# Patient Record
Sex: Female | Born: 1935
Health system: Southern US, Community
[De-identification: ages and names within clinical notes are randomized; demographics above are authoritative.]

## PROBLEM LIST (undated history)

## (undated) DIAGNOSIS — K219 Gastro-esophageal reflux disease without esophagitis: Secondary | ICD-10-CM

## (undated) DIAGNOSIS — E039 Hypothyroidism, unspecified: Secondary | ICD-10-CM

## (undated) DIAGNOSIS — I10 Essential (primary) hypertension: Secondary | ICD-10-CM

## (undated) DIAGNOSIS — E079 Disorder of thyroid, unspecified: Secondary | ICD-10-CM

## (undated) DIAGNOSIS — E119 Type 2 diabetes mellitus without complications: Secondary | ICD-10-CM

## (undated) HISTORY — PX: BREAST SURGERY: SHX581

---

## 2003-02-05 ENCOUNTER — Encounter: Payer: Self-pay | Admitting: Family Medicine

## 2003-02-05 ENCOUNTER — Ambulatory Visit (HOSPITAL_COMMUNITY): Admission: RE | Admit: 2003-02-05 | Discharge: 2003-02-05 | Payer: Self-pay | Admitting: Family Medicine

## 2008-07-09 ENCOUNTER — Ambulatory Visit (HOSPITAL_COMMUNITY): Admission: RE | Admit: 2008-07-09 | Discharge: 2008-07-09 | Payer: Self-pay | Admitting: Family Medicine

## 2009-05-26 ENCOUNTER — Ambulatory Visit (HOSPITAL_COMMUNITY): Admission: RE | Admit: 2009-05-26 | Discharge: 2009-05-26 | Payer: Self-pay | Admitting: Family Medicine

## 2010-10-08 ENCOUNTER — Encounter: Payer: Self-pay | Admitting: Family Medicine

## 2011-04-25 ENCOUNTER — Other Ambulatory Visit (HOSPITAL_COMMUNITY): Payer: Self-pay | Admitting: Family Medicine

## 2011-04-25 DIAGNOSIS — I739 Peripheral vascular disease, unspecified: Secondary | ICD-10-CM

## 2011-05-10 ENCOUNTER — Other Ambulatory Visit (HOSPITAL_COMMUNITY): Payer: Self-pay

## 2011-06-04 ENCOUNTER — Other Ambulatory Visit (HOSPITAL_COMMUNITY): Payer: Self-pay

## 2012-09-29 ENCOUNTER — Other Ambulatory Visit (HOSPITAL_COMMUNITY): Payer: Self-pay | Admitting: Family Medicine

## 2012-09-29 DIAGNOSIS — Z139 Encounter for screening, unspecified: Secondary | ICD-10-CM

## 2012-10-02 ENCOUNTER — Ambulatory Visit (HOSPITAL_COMMUNITY)
Admission: RE | Admit: 2012-10-02 | Discharge: 2012-10-02 | Disposition: A | Discharge status: Home / Self Care | Payer: Medicare Other | Source: Ambulatory Visit | Attending: Family Medicine | Admitting: Family Medicine

## 2012-10-02 DIAGNOSIS — Z139 Encounter for screening, unspecified: Secondary | ICD-10-CM

## 2013-12-17 ENCOUNTER — Other Ambulatory Visit (HOSPITAL_COMMUNITY): Payer: Self-pay | Admitting: Family Medicine

## 2013-12-17 DIAGNOSIS — Z1231 Encounter for screening mammogram for malignant neoplasm of breast: Secondary | ICD-10-CM

## 2013-12-29 ENCOUNTER — Ambulatory Visit (HOSPITAL_COMMUNITY): Payer: Medicare Other

## 2014-09-04 ENCOUNTER — Inpatient Hospital Stay (HOSPITAL_COMMUNITY)
Admission: EM | Admit: 2014-09-04 | Discharge: 2014-09-07 | DRG: 441 | Disposition: A | Discharge status: Home / Self Care | Payer: Medicare Other | Attending: General Surgery | Admitting: General Surgery

## 2014-09-04 ENCOUNTER — Encounter (HOSPITAL_COMMUNITY): Payer: Self-pay | Admitting: Emergency Medicine

## 2014-09-04 ENCOUNTER — Emergency Department (HOSPITAL_COMMUNITY): Payer: Medicare Other

## 2014-09-04 DIAGNOSIS — E119 Type 2 diabetes mellitus without complications: Secondary | ICD-10-CM | POA: Diagnosis present

## 2014-09-04 DIAGNOSIS — S36113A Laceration of liver, unspecified degree, initial encounter: Principal | ICD-10-CM | POA: Diagnosis present

## 2014-09-04 DIAGNOSIS — K661 Hemoperitoneum: Secondary | ICD-10-CM | POA: Diagnosis present

## 2014-09-04 DIAGNOSIS — D62 Acute posthemorrhagic anemia: Secondary | ICD-10-CM | POA: Diagnosis not present

## 2014-09-04 DIAGNOSIS — I10 Essential (primary) hypertension: Secondary | ICD-10-CM | POA: Diagnosis present

## 2014-09-04 DIAGNOSIS — R1011 Right upper quadrant pain: Secondary | ICD-10-CM

## 2014-09-04 DIAGNOSIS — E079 Disorder of thyroid, unspecified: Secondary | ICD-10-CM | POA: Diagnosis present

## 2014-09-04 DIAGNOSIS — S36115A Moderate laceration of liver, initial encounter: Secondary | ICD-10-CM | POA: Diagnosis present

## 2014-09-04 DIAGNOSIS — I951 Orthostatic hypotension: Secondary | ICD-10-CM

## 2014-09-04 DIAGNOSIS — D649 Anemia, unspecified: Secondary | ICD-10-CM | POA: Diagnosis not present

## 2014-09-04 HISTORY — DX: Disorder of thyroid, unspecified: E07.9

## 2014-09-04 HISTORY — DX: Type 2 diabetes mellitus without complications: E11.9

## 2014-09-04 HISTORY — DX: Essential (primary) hypertension: I10

## 2014-09-04 MED ORDER — SODIUM CHLORIDE 0.9 % IV SOLN
1000.0000 mL | Freq: Once | INTRAVENOUS | Status: AC
  Administered 2014-09-04: 1000 mL via INTRAVENOUS

## 2014-09-04 MED ORDER — SODIUM CHLORIDE 0.9 % IV SOLN
1000.0000 mL | INTRAVENOUS | Status: DC
  Administered 2014-09-05: 1000 mL via INTRAVENOUS
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
## 2014-09-05 ENCOUNTER — Encounter (HOSPITAL_COMMUNITY): Payer: Self-pay | Admitting: *Deleted

## 2014-09-05 DIAGNOSIS — K661 Hemoperitoneum: Secondary | ICD-10-CM | POA: Diagnosis present

## 2014-09-05 DIAGNOSIS — S36115A Moderate laceration of liver, initial encounter: Secondary | ICD-10-CM | POA: Diagnosis present

## 2014-09-05 DIAGNOSIS — R1011 Right upper quadrant pain: Secondary | ICD-10-CM | POA: Diagnosis present

## 2014-09-05 DIAGNOSIS — I10 Essential (primary) hypertension: Secondary | ICD-10-CM | POA: Diagnosis present

## 2014-09-05 DIAGNOSIS — S36113A Laceration of liver, unspecified degree, initial encounter: Secondary | ICD-10-CM | POA: Diagnosis present

## 2014-09-05 DIAGNOSIS — D62 Acute posthemorrhagic anemia: Secondary | ICD-10-CM | POA: Diagnosis not present

## 2014-09-05 DIAGNOSIS — E079 Disorder of thyroid, unspecified: Secondary | ICD-10-CM | POA: Diagnosis present

## 2014-09-05 DIAGNOSIS — E119 Type 2 diabetes mellitus without complications: Secondary | ICD-10-CM | POA: Diagnosis present

## 2014-09-05 LAB — COMPREHENSIVE METABOLIC PANEL
ALBUMIN: 3.2 g/dL — AB (ref 3.5–5.2)
ALK PHOS: 40 U/L (ref 39–117)
ALT: 16 U/L (ref 0–35)
ALT: 17 U/L (ref 0–35)
AST: 20 U/L (ref 0–37)
AST: 21 U/L (ref 0–37)
Albumin: 3.4 g/dL — ABNORMAL LOW (ref 3.5–5.2)
Alkaline Phosphatase: 45 U/L (ref 39–117)
Anion gap: 14 (ref 5–15)
Anion gap: 16 — ABNORMAL HIGH (ref 5–15)
BUN: 21 mg/dL (ref 6–23)
BUN: 25 mg/dL — ABNORMAL HIGH (ref 6–23)
CALCIUM: 7.8 mg/dL — AB (ref 8.4–10.5)
CHLORIDE: 102 meq/L (ref 96–112)
CO2: 16 meq/L — AB (ref 19–32)
CO2: 21 mEq/L (ref 19–32)
CREATININE: 1 mg/dL (ref 0.50–1.10)
Calcium: 7.7 mg/dL — ABNORMAL LOW (ref 8.4–10.5)
Chloride: 103 mEq/L (ref 96–112)
Creatinine, Ser: 1.35 mg/dL — ABNORMAL HIGH (ref 0.50–1.10)
GFR calc Af Amer: 41 mL/min — ABNORMAL LOW (ref >90)
GFR calc Af Amer: 60 mL/min — ABNORMAL LOW (ref >90)
GFR calc non Af Amer: 36 mL/min — ABNORMAL LOW (ref >90)
GFR, EST NON AFRICAN AMERICAN: 51 mL/min — AB (ref >90)
Glucose, Bld: 161 mg/dL — ABNORMAL HIGH (ref 70–99)
Glucose, Bld: 176 mg/dL — ABNORMAL HIGH (ref 70–99)
POTASSIUM: 4.4 meq/L (ref 3.7–5.3)
Potassium: 4.8 mEq/L (ref 3.7–5.3)
SODIUM: 135 meq/L — AB (ref 137–147)
Sodium: 137 mEq/L (ref 137–147)
Total Bilirubin: 0.3 mg/dL (ref 0.3–1.2)
Total Bilirubin: 0.9 mg/dL (ref 0.3–1.2)
Total Protein: 5.7 g/dL — ABNORMAL LOW (ref 6.0–8.3)
Total Protein: 6.1 g/dL (ref 6.0–8.3)

## 2014-09-05 LAB — PROTIME-INR
INR: 1.11 (ref 0.00–1.49)
Prothrombin Time: 14.4 seconds (ref 11.6–15.2)

## 2014-09-05 LAB — SAMPLE TO BLOOD BANK

## 2014-09-05 LAB — CBC WITH DIFFERENTIAL/PLATELET
Basophils Absolute: 0 10*3/uL (ref 0.0–0.1)
Basophils Absolute: 0 10*3/uL (ref 0.0–0.1)
Basophils Relative: 0 % (ref 0–1)
Basophils Relative: 0 % (ref 0–1)
Eosinophils Absolute: 0 10*3/uL (ref 0.0–0.7)
Eosinophils Absolute: 0 10*3/uL (ref 0.0–0.7)
Eosinophils Relative: 0 % (ref 0–5)
Eosinophils Relative: 0 % (ref 0–5)
HCT: 25.4 % — ABNORMAL LOW (ref 36.0–46.0)
HCT: 37.8 % (ref 36.0–46.0)
Hemoglobin: 12.7 g/dL (ref 12.0–15.0)
Hemoglobin: 8.6 g/dL — ABNORMAL LOW (ref 12.0–15.0)
Lymphocytes Relative: 10 % — ABNORMAL LOW (ref 12–46)
Lymphocytes Relative: 11 % — ABNORMAL LOW (ref 12–46)
Lymphs Abs: 0.9 10*3/uL (ref 0.7–4.0)
Lymphs Abs: 1 10*3/uL (ref 0.7–4.0)
MCH: 30.9 pg (ref 26.0–34.0)
MCH: 31.1 pg (ref 26.0–34.0)
MCHC: 33.6 g/dL (ref 30.0–36.0)
MCHC: 33.9 g/dL (ref 30.0–36.0)
MCV: 91.4 fL (ref 78.0–100.0)
MCV: 92.4 fL (ref 78.0–100.0)
Monocytes Absolute: 0.4 10*3/uL (ref 0.1–1.0)
Monocytes Absolute: 0.4 10*3/uL (ref 0.1–1.0)
Monocytes Relative: 4 % (ref 3–12)
Monocytes Relative: 5 % (ref 3–12)
NEUTROS PCT: 85 % — AB (ref 43–77)
NEUTROS PCT: 85 % — AB (ref 43–77)
Neutro Abs: 7.6 10*3/uL (ref 1.7–7.7)
Neutro Abs: 7.8 10*3/uL — ABNORMAL HIGH (ref 1.7–7.7)
PLATELETS: 175 10*3/uL (ref 150–400)
Platelets: 148 10*3/uL — ABNORMAL LOW (ref 150–400)
RBC: 2.78 MIL/uL — AB (ref 3.87–5.11)
RBC: 4.09 MIL/uL (ref 3.87–5.11)
RDW: 12.6 % (ref 11.5–15.5)
RDW: 13.2 % (ref 11.5–15.5)
WBC: 8.9 10*3/uL (ref 4.0–10.5)
WBC: 9.2 10*3/uL (ref 4.0–10.5)

## 2014-09-05 LAB — I-STAT CHEM 8, ED
BUN: 24 mg/dL — ABNORMAL HIGH (ref 6–23)
Calcium, Ion: 1.06 mmol/L — ABNORMAL LOW (ref 1.13–1.30)
Chloride: 105 mEq/L (ref 96–112)
Creatinine, Ser: 1.3 mg/dL — ABNORMAL HIGH (ref 0.50–1.10)
Glucose, Bld: 173 mg/dL — ABNORMAL HIGH (ref 70–99)
HEMATOCRIT: 24 % — AB (ref 36.0–46.0)
HEMOGLOBIN: 8.2 g/dL — AB (ref 12.0–15.0)
POTASSIUM: 4.3 meq/L (ref 3.7–5.3)
Sodium: 137 mEq/L (ref 137–147)
TCO2: 19 mmol/L (ref 0–100)

## 2014-09-05 LAB — CBC
HCT: 30.9 % — ABNORMAL LOW (ref 36.0–46.0)
HEMATOCRIT: 36.8 % (ref 36.0–46.0)
HEMATOCRIT: 31.3 % — AB (ref 36.0–46.0)
Hemoglobin: 12.4 g/dL (ref 12.0–15.0)
Hemoglobin: 10.8 g/dL — ABNORMAL LOW (ref 12.0–15.0)
Hemoglobin: 10.5 g/dL — ABNORMAL LOW (ref 12.0–15.0)
MCH: 31.1 pg (ref 26.0–34.0)
MCH: 30.8 pg (ref 26.0–34.0)
MCH: 30.3 pg (ref 26.0–34.0)
MCHC: 33.7 g/dL (ref 30.0–36.0)
MCHC: 34.5 g/dL (ref 30.0–36.0)
MCHC: 34 g/dL (ref 30.0–36.0)
MCV: 92.2 fL (ref 78.0–100.0)
MCV: 89.2 fL (ref 78.0–100.0)
MCV: 89.3 fL (ref 78.0–100.0)
PLATELETS: 137 10*3/uL — AB (ref 150–400)
Platelets: 135 10*3/uL — ABNORMAL LOW (ref 150–400)
Platelets: 130 10*3/uL — ABNORMAL LOW (ref 150–400)
RBC: 3.99 MIL/uL (ref 3.87–5.11)
RBC: 3.51 MIL/uL — ABNORMAL LOW (ref 3.87–5.11)
RBC: 3.46 MIL/uL — AB (ref 3.87–5.11)
RDW: 13.1 % (ref 11.5–15.5)
RDW: 13.3 % (ref 11.5–15.5)
RDW: 13.4 % (ref 11.5–15.5)
WBC: 10.3 10*3/uL (ref 4.0–10.5)
WBC: 9.2 10*3/uL (ref 4.0–10.5)
WBC: 8.2 10*3/uL (ref 4.0–10.5)

## 2014-09-05 LAB — ABO/RH: ABO/RH(D): A POS

## 2014-09-05 LAB — MRSA PCR SCREENING: MRSA by PCR: NEGATIVE

## 2014-09-05 LAB — CBG MONITORING, ED: Glucose-Capillary: 165 mg/dL — ABNORMAL HIGH (ref 70–99)

## 2014-09-05 LAB — TROPONIN I

## 2014-09-05 LAB — PREPARE RBC (CROSSMATCH)

## 2014-09-05 LAB — LIPASE, BLOOD: LIPASE: 16 U/L (ref 11–59)

## 2014-09-05 MED ORDER — ONDANSETRON HCL 4 MG/2ML IJ SOLN
INTRAMUSCULAR | Status: AC
Start: 2014-09-05 — End: 2014-09-05
  Administered 2014-09-05: 03:00:00
  Filled 2014-09-05: qty 2

## 2014-09-05 MED ORDER — ONDANSETRON HCL 4 MG PO TABS: 4.0000 mg | ORAL_TABLET | Freq: Four times a day (QID) | ORAL | Status: DC | PRN

## 2014-09-05 MED ORDER — PANTOPRAZOLE SODIUM 40 MG IV SOLR
40.0000 mg | Freq: Every day | INTRAVENOUS | Status: DC
  Filled 2014-09-05: qty 40

## 2014-09-05 MED ORDER — SODIUM CHLORIDE 0.9 % IV SOLN
Freq: Once | INTRAVENOUS | Status: AC
  Administered 2014-09-05: 02:00:00 via INTRAVENOUS

## 2014-09-05 MED ORDER — MORPHINE SULFATE 2 MG/ML IJ SOLN: 2.0000 mg | INTRAMUSCULAR | Status: DC | PRN

## 2014-09-05 MED ORDER — ACETAMINOPHEN 10 MG/ML IV SOLN
1000.0000 mg | Freq: Once | INTRAVENOUS | Status: AC
  Administered 2014-09-05: 1000 mg via INTRAVENOUS
  Filled 2014-09-05: qty 100

## 2014-09-05 MED ORDER — ONDANSETRON HCL 4 MG/2ML IJ SOLN: 4.0000 mg | Freq: Four times a day (QID) | INTRAMUSCULAR | Status: DC | PRN

## 2014-09-05 MED ORDER — KCL IN DEXTROSE-NACL 20-5-0.45 MEQ/L-%-% IV SOLN
INTRAVENOUS | Status: DC
  Administered 2014-09-05: 06:00:00 via INTRAVENOUS
  Filled 2014-09-05 (×4): qty 1000

## 2014-09-05 MED ORDER — LEVOTHYROXINE SODIUM 100 MCG IV SOLR
12.5000 ug | Freq: Every day | INTRAVENOUS | Status: DC
  Filled 2014-09-05 (×2): qty 5

## 2014-09-05 MED ORDER — IOHEXOL 300 MG/ML  SOLN
100.0000 mL | Freq: Once | INTRAMUSCULAR | Status: AC | PRN
  Administered 2014-09-05: 100 mL via INTRAVENOUS

## 2014-09-05 MED ORDER — PANTOPRAZOLE SODIUM 40 MG PO TBEC
40.0000 mg | DELAYED_RELEASE_TABLET | Freq: Every day | ORAL | Status: DC
  Administered 2014-09-05: 40 mg via ORAL
  Filled 2014-09-05: qty 1

## 2014-09-05 MED ORDER — FENTANYL CITRATE 0.05 MG/ML IJ SOLN
25.0000 ug | Freq: Once | INTRAMUSCULAR | Status: AC
  Administered 2014-09-05: 25 ug via INTRAVENOUS
  Filled 2014-09-05: qty 2

## 2014-09-05 NOTE — ED Notes (Signed)
Patient arrived from [facility] via [facility].  Patient is CAOx3, VSS at this time.   Dr Mora Bellmanni in to see patient.  Trauma to be notified of patient arrival.

## 2014-09-05 NOTE — ED Notes (Signed)
Trauma surgeon at the bedside at this time speaking with patient and family.

## 2014-09-05 NOTE — Progress Notes (Signed)
Dr Dwain SarnaWakefield notified of most recent HGB 10.8. He stated pt to remain on ice chips and sips diet until readdressed in the morning, patient updated and ok on plan. Dr Dwain SarnaWakefield also notified of pt with HTN with bp's 180s/50s. He stated due to diastolic in 50s it is ok to not treat at this point. Will continue to monitor.

## 2014-09-05 NOTE — Progress Notes (Signed)
hct stable, mild tender, feels fine now, if next hct stable will let her have some clears

## 2014-09-05 NOTE — Progress Notes (Signed)
Order for foley insertion. Patient was able to void on her own in bedpan. She did not want catheter if she was able to void.
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
## 2014-09-05 NOTE — ED Notes (Signed)
Family at bedside. Patient given Biotene to moisten lips. Explained to patient that she can not have anything to drink at this time.

## 2014-09-06 LAB — CBC
HEMATOCRIT: 31.9 % — AB (ref 36.0–46.0)
HEMOGLOBIN: 10.8 g/dL — AB (ref 12.0–15.0)
MCH: 30.3 pg (ref 26.0–34.0)
MCHC: 33.9 g/dL (ref 30.0–36.0)
MCV: 89.6 fL (ref 78.0–100.0)
Platelets: 129 10*3/uL — ABNORMAL LOW (ref 150–400)
RBC: 3.56 MIL/uL — ABNORMAL LOW (ref 3.87–5.11)
RDW: 13.4 % (ref 11.5–15.5)
WBC: 7.5 10*3/uL (ref 4.0–10.5)

## 2014-09-06 LAB — TYPE AND SCREEN
ABO/RH(D): A POS
ANTIBODY SCREEN: NEGATIVE
UNIT DIVISION: 0
Unit division: 0

## 2014-09-06 MED ORDER — ACETAMINOPHEN 325 MG PO TABS: 650.0000 mg | ORAL_TABLET | Freq: Four times a day (QID) | ORAL | Status: DC | PRN

## 2014-09-06 MED ORDER — LEVOTHYROXINE SODIUM 25 MCG PO TABS
25.0000 ug | ORAL_TABLET | Freq: Every day | ORAL | Status: DC
  Administered 2014-09-06 – 2014-09-07 (×2): 25 ug via ORAL
  Filled 2014-09-06 (×5): qty 1

## 2014-09-06 MED ORDER — LOSARTAN POTASSIUM 50 MG PO TABS
50.0000 mg | ORAL_TABLET | Freq: Every day | ORAL | Status: DC
  Administered 2014-09-06 – 2014-09-07 (×2): 50 mg via ORAL
  Filled 2014-09-06 (×2): qty 1

## 2014-09-06 MED ORDER — SODIUM CHLORIDE 0.9 % IJ SOLN: 3.0000 mL | INTRAMUSCULAR | Status: DC | PRN
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
## 2014-09-07 DIAGNOSIS — I10 Essential (primary) hypertension: Secondary | ICD-10-CM | POA: Diagnosis present

## 2014-09-07 DIAGNOSIS — D62 Acute posthemorrhagic anemia: Secondary | ICD-10-CM | POA: Diagnosis not present

## 2014-09-07 DIAGNOSIS — D649 Anemia, unspecified: Secondary | ICD-10-CM | POA: Diagnosis not present

## 2014-09-07 DIAGNOSIS — E079 Disorder of thyroid, unspecified: Secondary | ICD-10-CM | POA: Diagnosis present

## 2014-09-07 LAB — CBC WITH DIFFERENTIAL/PLATELET
BASOS ABS: 0 10*3/uL (ref 0.0–0.1)
Basophils Relative: 0 % (ref 0–1)
EOS PCT: 2 % (ref 0–5)
Eosinophils Absolute: 0.1 10*3/uL (ref 0.0–0.7)
HEMATOCRIT: 29.5 % — AB (ref 36.0–46.0)
Hemoglobin: 9.9 g/dL — ABNORMAL LOW (ref 12.0–15.0)
Lymphocytes Relative: 31 % (ref 12–46)
Lymphs Abs: 2.1 10*3/uL (ref 0.7–4.0)
MCH: 30.9 pg (ref 26.0–34.0)
MCHC: 33.6 g/dL (ref 30.0–36.0)
MCV: 92.2 fL (ref 78.0–100.0)
MONO ABS: 0.6 10*3/uL (ref 0.1–1.0)
Monocytes Relative: 8 % (ref 3–12)
Neutro Abs: 4 10*3/uL (ref 1.7–7.7)
Neutrophils Relative %: 59 % (ref 43–77)
Platelets: 136 10*3/uL — ABNORMAL LOW (ref 150–400)
RBC: 3.2 MIL/uL — ABNORMAL LOW (ref 3.87–5.11)
RDW: 13.3 % (ref 11.5–15.5)
WBC: 6.8 10*3/uL (ref 4.0–10.5)
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
## 2014-09-07 NOTE — Discharge Instructions (Signed)
No running, jumping, ball or contact sports, bikes, skateboards, motorcycles, etc for 6 weeks. ° °

## 2014-09-07 NOTE — Progress Notes (Signed)
Patient discharged to home with instructions. 

## 2014-11-25 DIAGNOSIS — N3000 Acute cystitis without hematuria: Secondary | ICD-10-CM | POA: Diagnosis not present

## 2014-12-01 DIAGNOSIS — I10 Essential (primary) hypertension: Secondary | ICD-10-CM | POA: Diagnosis not present

## 2014-12-01 DIAGNOSIS — E063 Autoimmune thyroiditis: Secondary | ICD-10-CM | POA: Diagnosis not present

## 2014-12-01 DIAGNOSIS — Z0000 Encounter for general adult medical examination without abnormal findings: Secondary | ICD-10-CM | POA: Diagnosis not present

## 2014-12-01 DIAGNOSIS — R7301 Impaired fasting glucose: Secondary | ICD-10-CM | POA: Diagnosis not present
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
## 2018-06-05 ENCOUNTER — Other Ambulatory Visit (HOSPITAL_COMMUNITY): Payer: Self-pay | Admitting: Physician Assistant

## 2018-06-05 DIAGNOSIS — E2839 Other primary ovarian failure: Secondary | ICD-10-CM

## 2018-06-05 DIAGNOSIS — Z1231 Encounter for screening mammogram for malignant neoplasm of breast: Secondary | ICD-10-CM

## 2018-09-25 DIAGNOSIS — R69 Illness, unspecified: Secondary | ICD-10-CM | POA: Diagnosis not present

## 2018-11-11 DIAGNOSIS — E063 Autoimmune thyroiditis: Secondary | ICD-10-CM | POA: Diagnosis not present

## 2018-11-11 DIAGNOSIS — Z1389 Encounter for screening for other disorder: Secondary | ICD-10-CM | POA: Diagnosis not present

## 2018-11-11 DIAGNOSIS — Z6822 Body mass index (BMI) 22.0-22.9, adult: Secondary | ICD-10-CM | POA: Diagnosis not present

## 2019-02-18 DIAGNOSIS — Z0000 Encounter for general adult medical examination without abnormal findings: Secondary | ICD-10-CM | POA: Diagnosis not present

## 2019-02-18 DIAGNOSIS — Z681 Body mass index (BMI) 19 or less, adult: Secondary | ICD-10-CM | POA: Diagnosis not present

## 2019-07-22 DIAGNOSIS — R69 Illness, unspecified: Secondary | ICD-10-CM | POA: Diagnosis not present

## 2019-08-31 DIAGNOSIS — R69 Illness, unspecified: Secondary | ICD-10-CM | POA: Diagnosis not present

## 2019-10-17 ENCOUNTER — Ambulatory Visit: Payer: Self-pay

## 2019-10-22 ENCOUNTER — Ambulatory Visit: Payer: Self-pay

## 2019-10-22 ENCOUNTER — Ambulatory Visit: Payer: Medicare HMO | Attending: Internal Medicine

## 2019-10-22 DIAGNOSIS — Z23 Encounter for immunization: Secondary | ICD-10-CM | POA: Insufficient documentation

## 2019-10-22 NOTE — Progress Notes (Signed)
   Covid-19 Vaccination Clinic  Name:  Olivia Ramsey    MRN: [id number] DOB: 08/26/1931  10/22/2019  Olivia Ramsey was observed post Covid-19 immunization for 15 minutes without incidence. She was provided with Vaccine Information Sheet and instruction to access the V-Safe system.   Olivia Ramsey was instructed to call 911 with any severe reactions post vaccine: Marland Kitchen Difficulty breathing  . Swelling of your face and throat  . A fast heartbeat  . A bad rash all over your body  . Dizziness and weakness    Immunizations Administered    Name Date Dose VIS Date Route   Pfizer COVID-19 Vaccine 10/22/2019 12:16 PM 0.3 mL 08/28/2019 Intramuscular   Manufacturer: [employer], [employer]   Lot: [id number]   NDC: [address]-1

## 2019-11-17 ENCOUNTER — Ambulatory Visit: Payer: Medicare HMO | Attending: Internal Medicine

## 2019-11-17 DIAGNOSIS — Z23 Encounter for immunization: Secondary | ICD-10-CM

## 2019-11-17 NOTE — Progress Notes (Signed)
   Covid-19 Vaccination Clinic  Name:  Olivia Ramsey    MRN: [id number] DOB: [birth year]  11/17/2019  Olivia Ramsey was observed post Covid-19 immunization for 15 minutes without incident. She was provided with Vaccine Information Sheet and instruction to access the V-Safe system.   Olivia Ramsey was instructed to call 911 with any severe reactions post vaccine: Marland Kitchen Difficulty breathing  . Swelling of face and throat  . A fast heartbeat  . A bad rash all over body  . Dizziness and weakness   Immunizations Administered    Name Date Dose VIS Date Route   Pfizer COVID-19 Vaccine 11/17/2019 11:37 AM 0.3 mL 08/28/2019 Intramuscular   Manufacturer: [employer], [employer]   Lot: [id number]   NDC: [address]-4

## 2019-12-03 DIAGNOSIS — H25813 Combined forms of age-related cataract, bilateral: Secondary | ICD-10-CM | POA: Diagnosis not present

## 2019-12-30 DIAGNOSIS — R69 Illness, unspecified: Secondary | ICD-10-CM | POA: Diagnosis not present

## 2020-01-13 DIAGNOSIS — R69 Illness, unspecified: Secondary | ICD-10-CM | POA: Diagnosis not present

## 2020-01-18 DIAGNOSIS — E119 Type 2 diabetes mellitus without complications: Secondary | ICD-10-CM | POA: Diagnosis not present

## 2020-01-18 DIAGNOSIS — H25813 Combined forms of age-related cataract, bilateral: Secondary | ICD-10-CM | POA: Diagnosis not present

## 2020-02-01 DIAGNOSIS — N189 Chronic kidney disease, unspecified: Secondary | ICD-10-CM | POA: Diagnosis not present

## 2020-02-01 DIAGNOSIS — E063 Autoimmune thyroiditis: Secondary | ICD-10-CM | POA: Diagnosis not present

## 2020-02-01 DIAGNOSIS — Z6821 Body mass index (BMI) 21.0-21.9, adult: Secondary | ICD-10-CM | POA: Diagnosis not present

## 2020-02-01 DIAGNOSIS — I10 Essential (primary) hypertension: Secondary | ICD-10-CM | POA: Diagnosis not present

## 2020-02-01 DIAGNOSIS — K219 Gastro-esophageal reflux disease without esophagitis: Secondary | ICD-10-CM | POA: Diagnosis not present

## 2020-02-01 DIAGNOSIS — Z1389 Encounter for screening for other disorder: Secondary | ICD-10-CM | POA: Diagnosis not present

## 2020-02-09 NOTE — H&P (Signed)
Surgical History & Physical  Patient Name: Olivia Ramsey DOB: [birth year]  Surgery: Cataract extraction with intraocular lens implant phacoemulsification; Left Eye  Surgeon: [name] MD Surgery Date:  02/29/2020 Pre-Op Date:  02/09/2020  HPI: A 80 Yr. old female patient Patient is here for a cataract evaluation of both eyes, referred by Dr. Charise Killian. Patient complains of blurry vision at a distance and at near. This has been going on for over a year. She is having a lot of trouble reading due to her vision. She also does not feel comfortable driving at night anymore. This is negatively affecting the patient's quality of life. She also has dry eyes and uses TheraTears 2-3x/day, which helps. HPI was performed by [name] .  Medical History: Dry Eyes PVD OU Cataracts High Blood Pressure Thyroid Problems  Review of Systems Cardiovascular High Blood Pressure Endocrine Thyroid disorder All recorded systems are negative except as noted above.  Social   Never smoked   Medication TheraTears,  Losartan, Levothyroxine Sodium,   Sx/Procedures Blood Transfusion,   Drug Allergies   NKDA  History & Physical: Heent:  Cataract, Left eye NECK: supple without bruits LUNGS: lungs clear to auscultation CV: regular rate and rhythm Abdomen: soft and non-tender  Impression & Plan: Assessment: 1.  COMBINED FORMS AGE RELATED CATARACT; Both Eyes (H25.813) 2.  Diabetes Type 2 No retinopathy (E11.9) 3.  VITREOUS DETACHMENT PVD; Both Eyes (H43.813) 4.  ASTIGMATISM, REGULAR; Both Eyes (H52.223)  Plan: 1.  Cataract accounts for the patient's decreased vision. This visual impairment is not correctable with a tolerable change in glasses or contact lenses. Cataract surgery with an implantation of a new lens should significantly improve the visual and functional status of the patient. Discussed all risks, benefits, alternatives, and potential complications. Discussed the procedures and recovery.  Patient desires to have surgery. A-scan ordered and performed today for intra-ocular lens calculations. The surgery will be performed in order to improve vision for driving, reading, and for eye examinations. Recommend phacoemulsification with intra-ocular lens. Left Eye. Dilates well - shugarcaine by protocol. Declines toric Lens OS only at this time. 2.  Stressed importance of blood sugar and blood pressure control, and also yearly eye examinations. Discussed the need for ongoing proactive ocular exams and treatment, hopefully before visual symptoms develop. Diabetic correspondence sent to PCP/endocrinologist today and/or within the past year. 3.  Old Asymptomatic. RD precautions given. Patient to call with increase in flashing lights/floaters/dark curtain. 4.  Declines toric Lens OS only at this time.

## 2020-02-22 DIAGNOSIS — H25812 Combined forms of age-related cataract, left eye: Secondary | ICD-10-CM | POA: Diagnosis not present

## 2020-02-24 DIAGNOSIS — Z6821 Body mass index (BMI) 21.0-21.9, adult: Secondary | ICD-10-CM | POA: Diagnosis not present

## 2020-02-24 DIAGNOSIS — R39191 Need to immediately re-void: Secondary | ICD-10-CM | POA: Diagnosis not present

## 2020-02-25 NOTE — Patient Instructions (Signed)
Rhiana C Collyer  02/25/2020     @PREFPERIOPPHARMACY @   Your procedure is scheduled on  02/29/2020 .  Report to 03/02/2020 at  1145  A.M.  Call this number if you have problems the morning of surgery:  [phone]   Remember:  Do not eat or drink after midnight.                          Take these medicines the morning of surgery with A SIP OF WATER  Levothyroxine.    Do not wear jewelry, make-up or nail polish.  Do not wear lotions, powders, or perfumes. Please wear deodorant and brush your teeth.  Do not shave 48 hours prior to surgery.  Men may shave face and neck.  Do not bring valuables to the hospital.  [facility] is not responsible for any belongings or valuables.  Contacts, dentures or bridgework may not be worn into surgery.  Leave your suitcase in the car.  After surgery it may be brought to your room.  For patients admitted to the hospital, discharge time will be determined by your treatment team.  Patients discharged the day of surgery will not be allowed to drive home.   Name and phone number of your driver:   family Special instructions:  DO NOT smoke the morning of your procedure.  Please read over the following fact sheets that you were given. Anesthesia Post-op Instructions and Care and Recovery After Surgery       Cataract Surgery, Care After This sheet gives you information about how to care for yourself after your procedure. Your health care provider may also give you more specific instructions. If you have problems or questions, contact your health care provider. What can I expect after the procedure? After the procedure, it is common to have:  Itching.  Discomfort.  Fluid discharge.  Sensitivity to light and to touch.  Bruising in or around the eye.  Mild blurred vision. Follow these instructions at home: Eye care   Do not touch or rub your eyes.  Protect your eyes as told by your health care provider. You may be told to wear a  protective eye shield or sunglasses.  Do not put a contact lens into the affected eye or eyes until your health care provider approves.  Keep the area around your eye clean and dry: ? Avoid swimming. ? Do not allow water to hit you directly in the face while showering. ? Keep soap and shampoo out of your eyes.  Check your eye every day for signs of infection. Watch for: ? Redness, swelling, or pain. ? Fluid, blood, or pus. ? Warmth. ? A bad smell. ? Vision that is getting worse. ? Sensitivity that is getting worse. Activity  Do not drive for 24 hours if you were given a sedative during your procedure.  Avoid strenuous activities, such as playing contact sports, for as long as told by your health care provider.  Do not drive or use heavy machinery until your health care provider approves.  Do not bend or lift heavy objects. Bending increases pressure in the eye. You can walk, climb stairs, and do light household chores.  Ask your health care provider when you can return to work. If you work in a dusty environment, you may be advised to wear protective eyewear for a period of time. General instructions  Take or apply over-the-counter and prescription medicines only  as told by your health care provider. This includes eye drops.  Keep all follow-up visits as told by your health care provider. This is important. Contact a health care provider if:  You have increased bruising around your eye.  You have pain that is not helped with medicine.  You have a fever.  You have redness, swelling, or pain in your eye.  You have fluid, blood, or pus coming from your incision.  Your vision gets worse.  Your sensitivity to light gets worse. Get help right away if:  You have sudden loss of vision.  You see flashes of light or spots (floaters).  You have severe eye pain.  You develop nausea or vomiting. Summary  After your procedure, it is common to have itching, discomfort,  bruising, fluid discharge, or sensitivity to light.  Follow instructions from your health care provider about caring for your eye after the procedure.  Do not rub your eye after the procedure. You may need to wear eye protection or sunglasses. Do not wear contact lenses. Keep the area around your eye clean and dry.  Avoid activities that require a lot of effort. These include playing sports and lifting heavy objects.  Contact a health care provider if you have increased bruising, pain that does not go away, or a fever. Get help right away if you suddenly lose your vision, see flashes of light or spots, or have severe pain in the eye. This information is not intended to replace advice given to you by your health care provider. Make sure you discuss any questions you have with your health care provider. Document Revised: 06/30/2019 Document Reviewed: 03/03/2018 Elsevier Patient Education  2020 [place] After These instructions provide you with information about caring for yourself after your procedure. Your health care provider may also give you more specific instructions. Your treatment has been planned according to current medical practices, but problems sometimes occur. Call your health care provider if you have any problems or questions after your procedure. What can I expect after the procedure? After your procedure, you may:  Feel sleepy for several hours.  Feel clumsy and have poor balance for several hours.  Feel forgetful about what happened after the procedure.  Have poor judgment for several hours.  Feel nauseous or vomit.  Have a sore throat if you had a breathing tube during the procedure. Follow these instructions at home: For at least 24 hours after the procedure:      Have a responsible adult stay with you. It is important to have someone help care for you until you are awake and alert.  Rest as needed.  Do not: ? Participate  in activities in which you could fall or become injured. ? Drive. ? Use heavy machinery. ? Drink alcohol. ? Take sleeping pills or medicines that cause drowsiness. ? Make important decisions or sign legal documents. ? Take care of children on your own. Eating and drinking  Follow the diet that is recommended by your health care provider.  If you vomit, drink water, juice, or soup when you can drink without vomiting.  Make sure you have little or no nausea before eating solid foods. General instructions  Take over-the-counter and prescription medicines only as told by your health care provider.  If you have sleep apnea, surgery and certain medicines can increase your risk for breathing problems. Follow instructions from your health care provider about wearing your sleep device: ? Anytime you are sleeping,  including during daytime naps. ? While taking prescription pain medicines, sleeping medicines, or medicines that make you drowsy.  If you smoke, do not smoke without supervision.  Keep all follow-up visits as told by your health care provider. This is important. Contact a health care provider if:  You keep feeling nauseous or you keep vomiting.  You feel light-headed.  You develop a rash.  You have a fever. Get help right away if:  You have trouble breathing. Summary  For several hours after your procedure, you may feel sleepy and have poor judgment.  Have a responsible adult stay with you for at least 24 hours or until you are awake and alert. This information is not intended to replace advice given to you by your health care provider. Make sure you discuss any questions you have with your health care provider. Document Revised: 12/02/2017 Document Reviewed: 12/25/2015 Elsevier Patient Education  [place].

## 2020-02-26 ENCOUNTER — Encounter (HOSPITAL_COMMUNITY)
Admission: RE | Admit: 2020-02-26 | Discharge: 2020-02-26 | Disposition: A | Discharge status: Home / Self Care | Payer: Medicare HMO | Source: Ambulatory Visit | Attending: Ophthalmology | Admitting: Ophthalmology

## 2020-02-26 ENCOUNTER — Encounter (HOSPITAL_COMMUNITY): Payer: Self-pay

## 2020-02-26 ENCOUNTER — Other Ambulatory Visit: Payer: Self-pay

## 2020-02-26 ENCOUNTER — Other Ambulatory Visit (HOSPITAL_COMMUNITY)
Admission: RE | Admit: 2020-02-26 | Discharge: 2020-02-26 | Disposition: A | Discharge status: Home / Self Care | Payer: Medicare HMO | Source: Ambulatory Visit | Attending: Ophthalmology | Admitting: Ophthalmology

## 2020-02-26 DIAGNOSIS — Z20822 Contact with and (suspected) exposure to covid-19: Secondary | ICD-10-CM | POA: Diagnosis not present

## 2020-02-26 DIAGNOSIS — Z01812 Encounter for preprocedural laboratory examination: Secondary | ICD-10-CM | POA: Insufficient documentation

## 2020-02-26 HISTORY — DX: Hypothyroidism, unspecified: E03.9

## 2020-02-26 LAB — SARS CORONAVIRUS 2 (TAT 6-24 HRS): SARS Coronavirus 2: NEGATIVE

## 2020-02-29 ENCOUNTER — Encounter (HOSPITAL_COMMUNITY): Payer: Self-pay | Admitting: Ophthalmology

## 2020-02-29 ENCOUNTER — Other Ambulatory Visit: Payer: Self-pay

## 2020-02-29 ENCOUNTER — Ambulatory Visit (HOSPITAL_COMMUNITY): Payer: Medicare HMO | Admitting: Anesthesiology

## 2020-02-29 ENCOUNTER — Encounter (HOSPITAL_COMMUNITY)
Admission: RE | Disposition: A | Discharge status: Home / Self Care | Payer: Self-pay | Source: Home / Self Care | Attending: Ophthalmology

## 2020-02-29 ENCOUNTER — Ambulatory Visit (HOSPITAL_COMMUNITY)
Admission: RE | Admit: 2020-02-29 | Discharge: 2020-02-29 | Disposition: A | Discharge status: Home / Self Care | Payer: Medicare HMO | Attending: Ophthalmology | Admitting: Ophthalmology

## 2020-02-29 DIAGNOSIS — E039 Hypothyroidism, unspecified: Secondary | ICD-10-CM | POA: Insufficient documentation

## 2020-02-29 DIAGNOSIS — I10 Essential (primary) hypertension: Secondary | ICD-10-CM | POA: Diagnosis not present

## 2020-02-29 DIAGNOSIS — H43813 Vitreous degeneration, bilateral: Secondary | ICD-10-CM | POA: Insufficient documentation

## 2020-02-29 DIAGNOSIS — E1136 Type 2 diabetes mellitus with diabetic cataract: Secondary | ICD-10-CM | POA: Insufficient documentation

## 2020-02-29 DIAGNOSIS — Z79890 Hormone replacement therapy (postmenopausal): Secondary | ICD-10-CM | POA: Diagnosis not present

## 2020-02-29 DIAGNOSIS — Z79899 Other long term (current) drug therapy: Secondary | ICD-10-CM | POA: Diagnosis not present

## 2020-02-29 DIAGNOSIS — H52223 Regular astigmatism, bilateral: Secondary | ICD-10-CM | POA: Diagnosis not present

## 2020-02-29 DIAGNOSIS — H25812 Combined forms of age-related cataract, left eye: Secondary | ICD-10-CM | POA: Insufficient documentation

## 2020-02-29 HISTORY — PX: CATARACT EXTRACTION W/PHACO: SHX586

## 2020-02-29 SURGERY — PHACOEMULSIFICATION, CATARACT, WITH IOL INSERTION
Anesthesia: Monitor Anesthesia Care | Site: Eye | Laterality: Left

## 2020-02-29 MED ORDER — LIDOCAINE HCL (PF) 1 % IJ SOLN
INTRAOCULAR | Status: DC | PRN
  Administered 2020-02-29: 1 mL via OPHTHALMIC

## 2020-02-29 MED ORDER — BSS IO SOLN
INTRAOCULAR | Status: DC | PRN
  Administered 2020-02-29: 15 mL via INTRAOCULAR

## 2020-02-29 MED ORDER — PHENYLEPHRINE HCL 2.5 % OP SOLN
1.0000 [drp] | OPHTHALMIC | Status: AC | PRN
  Administered 2020-02-29 (×3): 1 [drp] via OPHTHALMIC

## 2020-02-29 MED ORDER — EPINEPHRINE PF 1 MG/ML IJ SOLN
INTRAOCULAR | Status: DC | PRN
  Administered 2020-02-29: 500 mL

## 2020-02-29 MED ORDER — CYCLOPENTOLATE-PHENYLEPHRINE 0.2-1 % OP SOLN
1.0000 [drp] | OPHTHALMIC | Status: AC | PRN
  Administered 2020-02-29 (×3): 1 [drp] via OPHTHALMIC

## 2020-02-29 MED ORDER — MIDAZOLAM HCL 2 MG/2ML IJ SOLN
INTRAMUSCULAR | Status: AC
  Filled 2020-02-29: qty 2

## 2020-02-29 MED ORDER — SODIUM HYALURONATE 23 MG/ML IO SOLN
INTRAOCULAR | Status: DC | PRN
  Administered 2020-02-29: 0.6 mL via INTRAOCULAR

## 2020-02-29 MED ORDER — MIDAZOLAM HCL 2 MG/2ML IJ SOLN
INTRAMUSCULAR | Status: DC | PRN
  Administered 2020-02-29: 1 mg via INTRAVENOUS

## 2020-02-29 MED ORDER — POVIDONE-IODINE 5 % OP SOLN
OPHTHALMIC | Status: DC | PRN
  Administered 2020-02-29: 1 via OPHTHALMIC

## 2020-02-29 MED ORDER — PROVISC 10 MG/ML IO SOLN
INTRAOCULAR | Status: DC | PRN
  Administered 2020-02-29: 0.85 mL via INTRAOCULAR

## 2020-02-29 MED ORDER — TETRACAINE HCL 0.5 % OP SOLN
1.0000 [drp] | OPHTHALMIC | Status: AC | PRN
  Administered 2020-02-29 (×3): 1 [drp] via OPHTHALMIC

## 2020-02-29 MED ORDER — LIDOCAINE HCL 3.5 % OP GEL: 1.0000 "application " | Freq: Once | OPHTHALMIC | Status: DC

## 2020-02-29 MED ORDER — NEOMYCIN-POLYMYXIN-DEXAMETH 3.5-10000-0.1 OP SUSP
OPHTHALMIC | Status: DC | PRN
  Administered 2020-02-29: 1 [drp] via OPHTHALMIC

## 2020-02-29 MED ORDER — SODIUM CHLORIDE 0.9% FLUSH
10.0000 mL | INTRAVENOUS | Status: DC | PRN
  Administered 2020-02-29: 3 mL via INTRAVENOUS

## 2020-02-29 MED ORDER — EPINEPHRINE PF 1 MG/ML IJ SOLN
INTRAMUSCULAR | Status: AC
  Filled 2020-02-29: qty 2

## 2020-02-29 SURGICAL SUPPLY — 14 items
CLOTH BEACON ORANGE TIMEOUT ST (SAFETY) ×2 IMPLANT
EYE SHIELD UNIVERSAL CLEAR (GAUZE/BANDAGES/DRESSINGS) ×2 IMPLANT
GLOVE BIOGEL PI IND STRL 6.5 (GLOVE) ×1 IMPLANT
GLOVE BIOGEL PI IND STRL 7.0 (GLOVE) ×1 IMPLANT
GLOVE BIOGEL PI INDICATOR 6.5 (GLOVE) ×1
GLOVE BIOGEL PI INDICATOR 7.0 (GLOVE) ×1
LENS ALC ACRYL/TECN (Ophthalmic Related) ×2 IMPLANT
NEEDLE HYPO 18GX1.5 BLUNT FILL (NEEDLE) ×2 IMPLANT
PAD ARMBOARD 7.5X6 YLW CONV (MISCELLANEOUS) ×2 IMPLANT
SYR TB 1ML LL NO SAFETY (SYRINGE) ×2 IMPLANT
TAPE SURG TRANSPORE 1 IN (GAUZE/BANDAGES/DRESSINGS) ×1 IMPLANT
TAPE SURGICAL TRANSPORE 1 IN (GAUZE/BANDAGES/DRESSINGS) ×2
VISCOELASTIC ADDITIONAL (OPHTHALMIC RELATED) ×2 IMPLANT
WATER STERILE IRR 250ML POUR (IV SOLUTION) ×2 IMPLANT

## 2020-02-29 NOTE — Anesthesia Postprocedure Evaluation (Signed)
Anesthesia Post Note  Patient: Olivia Ramsey  Procedure(s) Performed: CATARACT EXTRACTION PHACO AND INTRAOCULAR LENS PLACEMENT (IOC) (Left Eye)  Patient location during evaluation: Short Stay Anesthesia Type: MAC Level of consciousness: awake and alert and patient cooperative Pain management: satisfactory to patient Vital Signs Assessment: post-procedure vital signs reviewed and stable Respiratory status: spontaneous breathing Cardiovascular status: stable Postop Assessment: no apparent nausea or vomiting Anesthetic complications: no   No complications documented.   Last Vitals:  Vitals:   02/29/20 1133 02/29/20 1300  BP: (!) 169/68 (!) 150/71  Pulse:  94  Resp:  18  Temp:  36.9 C  SpO2:  100%    Last Pain:  Vitals:   02/29/20 1300  TempSrc: Oral  PainSc:                  Drucie Opitz

## 2020-02-29 NOTE — Discharge Instructions (Addendum)
Please discharge patient when stable, will follow up today with Dr. Wrzosek at the [facility] office immediately following discharge.  Leave shield in place until visit.  All paperwork with discharge instructions will be given at the office.  [facility] Address:  [address]  [place], [place] [address]   PATIENT INSTRUCTIONS POST-ANESTHESIA  IMMEDIATELY FOLLOWING SURGERY:  Do not drive or operate machinery for the first twenty four hours after surgery.  Do not make any important decisions for twenty four hours after surgery or while taking narcotic pain medications or sedatives.  If you develop intractable nausea and vomiting or a severe headache please notify your doctor immediately.  FOLLOW-UP:  Please make an appointment with your surgeon as instructed. You do not need to follow up with anesthesia unless specifically instructed to do so.  WOUND CARE INSTRUCTIONS (if applicable):  Keep a dry clean dressing on the anesthesia/puncture wound site if there is drainage.  Once the wound has quit draining you may leave it open to air.  Generally you should leave the bandage intact for twenty four hours unless there is drainage.  If the epidural site drains for more than 36-48 hours please call the anesthesia department.  QUESTIONS?:  Please feel free to call your physician or the hospital operator if you have any questions, and they will be happy to assist you.       

## 2020-02-29 NOTE — Op Note (Signed)
Date of procedure: 02/29/20  Pre-operative diagnosis: Visually significant age-related combined cataract, Left Eye (H25.812)  Post-operative diagnosis: Visually significant age-related combined cataract, Left Eye (H25.812)  Procedure: Removal of cataract via phacoemulsification and insertion of intra-ocular lens Wynetta Emery and [place]  +23.5D into the capsular bag of the Left Eye  Attending surgeon: Gerda Diss. Jackalyn Haith, MD, MA  Anesthesia: MAC, Topical [place]  Complications: None  Estimated Blood Loss: <43m (minimal)  Specimens: None  Implants: As above  Indications:  Visually significant age-related cataract, Left Eye  Procedure:  The patient was seen and identified in the pre-operative area. The operative eye was identified and dilated.  The operative eye was marked.  Topical anesthesia was administered to the operative eye.     The patient was then to the operative suite and placed in the supine position.  A timeout was performed confirming the patient, procedure to be performed, and all other relevant information.   The patient's face was prepped and draped in the usual fashion for intra-ocular surgery.  A lid speculum was placed into the operative eye and the surgical microscope moved into place and focused.  An inferotemporal paracentesis was created using a 20 gauge paracentesis blade.  Shugarcaine was injected into the anterior chamber.  Viscoelastic was injected into the anterior chamber.  A temporal clear-corneal main wound incision was created using a 2.425mmicrokeratome.  A continuous curvilinear capsulorrhexis was initiated using an irrigating cystitome and completed using capsulorrhexis forceps.  Hydrodissection and hydrodeliniation were performed.  Viscoelastic was injected into the anterior chamber.  A phacoemulsification handpiece and a chopper as a second instrument were used to remove the nucleus and epinucleus. The irrigation/aspiration handpiece was used to remove  any remaining cortical material.   The capsular bag was reinflated with viscoelastic, checked, and found to be intact.  The intraocular lens was inserted into the capsular bag.  The irrigation/aspiration handpiece was used to remove any remaining viscoelastic.  The clear corneal wound and paracentesis wounds were then hydrated and checked with Weck-Cels to be watertight.  The lid-speculum was removed.  The drape was removed.  The patient's face was cleaned with a wet and dry 4x4.   Maxitrol was instilled in the eye. A clear shield was taped over the eye. The patient was taken to the post-operative care unit in good condition, having tolerated the procedure well.  Post-Op Instructions: [facility] and will receive all other orders and instructions.

## 2020-02-29 NOTE — Transfer of Care (Signed)
Immediate Anesthesia Transfer of Care Note  Patient: Olivia Ramsey  Procedure(s) Performed: CATARACT EXTRACTION PHACO AND INTRAOCULAR LENS PLACEMENT (IOC) (Left Eye)  Patient Location: Short Stay  Anesthesia Type:MAC  Level of Consciousness: awake, alert  and patient cooperative  Airway & Oxygen Therapy: Patient Spontanous Breathing  Post-op Assessment: Report given to RN and Post -op Vital signs reviewed and stable  Post vital signs: Reviewed and stableSEE PACU FLOW SHEET FOR VITALS  Last Vitals:  Vitals Value Taken Time  BP    Temp    Pulse    Resp    SpO2      Last Pain:  Vitals:   02/29/20 1101  TempSrc: Oral  PainSc: 0-No pain         Complications: No complications documented.

## 2020-02-29 NOTE — Interval H&P Note (Signed)
History and Physical Interval Note: The H and P was reviewed and updated. The patient was examined.  No changes were found after exam.  The surgical eye was marked.  02/29/2020 12:36 PM  Olivia Ramsey  has presented today for surgery, with the diagnosis of Nuclear sclerotic cataract - Left eye.  The various methods of treatment have been discussed with the patient and family. After consideration of risks, benefits and other options for treatment, the patient has consented to  Procedure(s) with comments: CATARACT EXTRACTION PHACO AND INTRAOCULAR LENS PLACEMENT (IOC) (Left) - CDE: as a surgical intervention.  The patient's history has been reviewed, patient examined, no change in status, stable for surgery.  I have reviewed the patient's chart and labs.  Questions were answered to the patient's satisfaction.     [name]

## 2020-02-29 NOTE — Anesthesia Procedure Notes (Signed)
Procedure Name: MAC Date/Time: 02/29/2020 12:41 PM Performed by: Vista Deck, CRNA Pre-anesthesia Checklist: Patient identified, Emergency Drugs available, Suction available, Timeout performed and Patient being monitored Patient Re-evaluated:Patient Re-evaluated prior to induction Oxygen Delivery Method: Nasal Cannula

## 2020-02-29 NOTE — Anesthesia Preprocedure Evaluation (Signed)
Anesthesia Evaluation  Patient identified by MRN, date of birth, ID band Patient awake    Reviewed: Allergy & Precautions, H&P , NPO status , Patient's Chart, lab work & pertinent test results, reviewed documented beta blocker date and time   Airway Mallampati: II  TM Distance: >3 FB Neck ROM: full    Dental no notable dental hx. (+) Teeth Intact   Pulmonary neg pulmonary ROS,    Pulmonary exam normal breath sounds clear to auscultation       Cardiovascular Exercise Tolerance: Good hypertension, negative cardio ROS   Rhythm:regular Rate:Normal     Neuro/Psych negative neurological ROS  negative psych ROS   GI/Hepatic negative GI ROS, Neg liver ROS,   Endo/Other  Hypothyroidism   Renal/GU negative Renal ROS  negative genitourinary   Musculoskeletal   Abdominal   Peds  Hematology  (+) Blood dyscrasia, anemia ,   Anesthesia Other Findings   Reproductive/Obstetrics negative OB ROS                             Anesthesia Physical Anesthesia Plan  ASA: II  Anesthesia Plan: MAC   Post-op Pain Management:    Induction:   PONV Risk Score and Plan: 3  Airway Management Planned:   Additional Equipment:   Intra-op Plan:   Post-operative Plan:   Informed Consent: I have reviewed the patients History and Physical, chart, labs and discussed the procedure including the risks, benefits and alternatives for the proposed anesthesia with the patient or authorized representative who has indicated his/her understanding and acceptance.     Dental Advisory Given  Plan Discussed with: CRNA  Anesthesia Plan Comments:         Anesthesia Quick Evaluation

## 2020-03-01 ENCOUNTER — Encounter (HOSPITAL_COMMUNITY): Payer: Self-pay | Admitting: Ophthalmology

## 2020-03-07 DIAGNOSIS — H25811 Combined forms of age-related cataract, right eye: Secondary | ICD-10-CM | POA: Diagnosis not present

## 2020-03-08 NOTE — H&P (Signed)
Surgical History & Physical  Patient Name: Olivia Ramsey DOB: 07-27-1931  Surgery: Cataract extraction with intraocular lens implant phacoemulsification; Right Eye  Surgeon: [name] MD Surgery Date:  03/14/2020 Pre-Op Date:  03/07/2020  HPI: A 38 Yr. old female patient 1. 1. The patient complains of difficulty when driving at night, which began 1 year ago. The right eye is affected. The episode is gradual. The condition's severity increased since last visit. Symptoms occur when the patient is driving and outside. HPI Completed by Dr. [name] 2. The patient is returning after cataract post-op. The left eye is affected. Status post cataract post-op, which began 1 week ago: Since the last visit, the affected area feels improvement. The patient's vision is improved. Patient is following medication instructions.  Medical History: Cataracts Dry Eyes PVD OU High Blood Pressure Thyroid Problems  Review of Systems Cardiovascular High Blood Pressure Endocrine Thyroid disorder All recorded systems are negative except as noted above.  Social   Never smoked   Medication TheraTears, Prednisolone-gatiflox-bromfenac,  Losartan, Levothyroxine Sodium,   Sx/Procedures Phaco c IOL OS,  Blood Transfusion,   Drug Allergies   NKDA  History & Physical: Heent:  Cataract, Right eye NECK: supple without bruits LUNGS: lungs clear to auscultation CV: regular rate and rhythm Abdomen: soft and non-tender  Impression & Plan: Assessment: 1.  CATARACT EXTRACTION STATUS; Left Eye (Z98.42) 2.  COMBINED FORMS AGE RELATED CATARACT; Both Eyes (H25.813)  Plan: 1.  1 week after cataract surgery. Doing well with improved vision and normal eye pressure. Call with any problems or concerns. Continue Gati-Brom-Pred 2x/day for 3 more weeks. 2.  Cataract accounts for the patient's decreased vision. This visual impairment is not correctable with a tolerable change in glasses or contact lenses. Cataract  surgery with an implantation of a new lens should significantly improve the visual and functional status of the patient. Discussed all risks, benefits, alternatives, and potential complications. Discussed the procedures and recovery. Patient desires to have surgery. A-scan ordered and performed today for intra-ocular lens calculations. The surgery will be performed in order to improve vision for driving, reading, and for eye examinations. Recommend phacoemulsification with intra-ocular lens. Right Eye. Surgery required to correct imbalance of vision. Dilates well - shugarcaine by protocol.

## 2020-03-09 ENCOUNTER — Encounter (HOSPITAL_COMMUNITY)
Admission: RE | Admit: 2020-03-09 | Discharge: 2020-03-09 | Disposition: A | Discharge status: Home / Self Care | Payer: Medicare HMO | Source: Ambulatory Visit | Attending: Ophthalmology | Admitting: Ophthalmology

## 2020-03-09 ENCOUNTER — Other Ambulatory Visit: Payer: Self-pay

## 2020-03-11 ENCOUNTER — Other Ambulatory Visit: Payer: Self-pay

## 2020-03-11 ENCOUNTER — Other Ambulatory Visit (HOSPITAL_COMMUNITY)
Admission: RE | Admit: 2020-03-11 | Discharge: 2020-03-11 | Disposition: A | Discharge status: Home / Self Care | Payer: Medicare HMO | Source: Ambulatory Visit | Attending: Ophthalmology | Admitting: Ophthalmology

## 2020-03-11 DIAGNOSIS — Z01812 Encounter for preprocedural laboratory examination: Secondary | ICD-10-CM | POA: Insufficient documentation

## 2020-03-11 DIAGNOSIS — Z20822 Contact with and (suspected) exposure to covid-19: Secondary | ICD-10-CM | POA: Diagnosis not present

## 2020-03-11 LAB — SARS CORONAVIRUS 2 (TAT 6-24 HRS): SARS Coronavirus 2: NEGATIVE

## 2020-03-14 ENCOUNTER — Ambulatory Visit (HOSPITAL_COMMUNITY)
Admission: RE | Admit: 2020-03-14 | Discharge: 2020-03-14 | Disposition: A | Discharge status: Home / Self Care | Payer: Medicare HMO | Attending: Ophthalmology | Admitting: Ophthalmology

## 2020-03-14 ENCOUNTER — Ambulatory Visit (HOSPITAL_COMMUNITY): Payer: Medicare HMO | Admitting: Anesthesiology

## 2020-03-14 ENCOUNTER — Encounter (HOSPITAL_COMMUNITY)
Admission: RE | Disposition: A | Discharge status: Home / Self Care | Payer: Self-pay | Source: Home / Self Care | Attending: Ophthalmology

## 2020-03-14 ENCOUNTER — Encounter (HOSPITAL_COMMUNITY): Payer: Self-pay | Admitting: Ophthalmology

## 2020-03-14 DIAGNOSIS — H04129 Dry eye syndrome of unspecified lacrimal gland: Secondary | ICD-10-CM | POA: Diagnosis not present

## 2020-03-14 DIAGNOSIS — I10 Essential (primary) hypertension: Secondary | ICD-10-CM | POA: Insufficient documentation

## 2020-03-14 DIAGNOSIS — Z79899 Other long term (current) drug therapy: Secondary | ICD-10-CM | POA: Diagnosis not present

## 2020-03-14 DIAGNOSIS — Z7952 Long term (current) use of systemic steroids: Secondary | ICD-10-CM | POA: Diagnosis not present

## 2020-03-14 DIAGNOSIS — E039 Hypothyroidism, unspecified: Secondary | ICD-10-CM | POA: Diagnosis not present

## 2020-03-14 DIAGNOSIS — H25811 Combined forms of age-related cataract, right eye: Secondary | ICD-10-CM | POA: Insufficient documentation

## 2020-03-14 DIAGNOSIS — Z9842 Cataract extraction status, left eye: Secondary | ICD-10-CM | POA: Insufficient documentation

## 2020-03-14 DIAGNOSIS — Z79890 Hormone replacement therapy (postmenopausal): Secondary | ICD-10-CM | POA: Diagnosis not present

## 2020-03-14 HISTORY — PX: CATARACT EXTRACTION W/PHACO: SHX586

## 2020-03-14 SURGERY — PHACOEMULSIFICATION, CATARACT, WITH IOL INSERTION
Anesthesia: Monitor Anesthesia Care | Site: Eye | Laterality: Right

## 2020-03-14 MED ORDER — POVIDONE-IODINE 5 % OP SOLN
OPHTHALMIC | Status: DC | PRN
  Administered 2020-03-14: 1 via OPHTHALMIC

## 2020-03-14 MED ORDER — SODIUM HYALURONATE 23 MG/ML IO SOLN
INTRAOCULAR | Status: DC | PRN
  Administered 2020-03-14: 0.6 mL via INTRAOCULAR

## 2020-03-14 MED ORDER — MIDAZOLAM HCL 2 MG/2ML IJ SOLN
INTRAMUSCULAR | Status: DC | PRN
  Administered 2020-03-14 (×2): 1 mg via INTRAVENOUS

## 2020-03-14 MED ORDER — LACTATED RINGERS IV SOLN: INTRAVENOUS | Status: DC

## 2020-03-14 MED ORDER — BSS IO SOLN
INTRAOCULAR | Status: DC | PRN
  Administered 2020-03-14: 15 mL via INTRAOCULAR

## 2020-03-14 MED ORDER — MIDAZOLAM HCL 2 MG/2ML IJ SOLN
INTRAMUSCULAR | Status: AC
  Filled 2020-03-14: qty 2

## 2020-03-14 MED ORDER — EPINEPHRINE PF 1 MG/ML IJ SOLN
INTRAOCULAR | Status: DC | PRN
  Administered 2020-03-14: 500 mL

## 2020-03-14 MED ORDER — NEOMYCIN-POLYMYXIN-DEXAMETH 3.5-10000-0.1 OP SUSP
OPHTHALMIC | Status: DC | PRN
  Administered 2020-03-14: 1 [drp] via OPHTHALMIC

## 2020-03-14 MED ORDER — SODIUM CHLORIDE 0.9% FLUSH
10.0000 mL | INTRAVENOUS | Status: DC | PRN
  Administered 2020-03-14 (×2): 3 mL via INTRAVENOUS

## 2020-03-14 MED ORDER — CYCLOPENTOLATE-PHENYLEPHRINE 0.2-1 % OP SOLN
1.0000 [drp] | OPHTHALMIC | Status: AC | PRN
  Administered 2020-03-14 (×3): 1 [drp] via OPHTHALMIC

## 2020-03-14 MED ORDER — PHENYLEPHRINE HCL 2.5 % OP SOLN
1.0000 [drp] | OPHTHALMIC | Status: AC | PRN
  Administered 2020-03-14 (×3): 1 [drp] via OPHTHALMIC

## 2020-03-14 MED ORDER — LIDOCAINE HCL 3.5 % OP GEL
1.0000 "application " | Freq: Once | OPHTHALMIC | Status: AC
  Administered 2020-03-14: 1 via OPHTHALMIC

## 2020-03-14 MED ORDER — TETRACAINE HCL 0.5 % OP SOLN
1.0000 [drp] | OPHTHALMIC | Status: AC | PRN
  Administered 2020-03-14 (×3): 1 [drp] via OPHTHALMIC

## 2020-03-14 MED ORDER — LIDOCAINE HCL (PF) 1 % IJ SOLN
INTRAOCULAR | Status: DC | PRN
  Administered 2020-03-14: 1 mL via OPHTHALMIC

## 2020-03-14 MED ORDER — EPINEPHRINE PF 1 MG/ML IJ SOLN
INTRAMUSCULAR | Status: AC
  Filled 2020-03-14: qty 2

## 2020-03-14 MED ORDER — PROVISC 10 MG/ML IO SOLN
INTRAOCULAR | Status: DC | PRN
  Administered 2020-03-14: 0.85 mL via INTRAOCULAR

## 2020-03-14 SURGICAL SUPPLY — 13 items
CLOTH BEACON ORANGE TIMEOUT ST (SAFETY) ×2 IMPLANT
EYE SHIELD UNIVERSAL CLEAR (GAUZE/BANDAGES/DRESSINGS) ×2 IMPLANT
GLOVE BIOGEL PI IND STRL 7.0 (GLOVE) ×2 IMPLANT
GLOVE BIOGEL PI INDICATOR 7.0 (GLOVE) ×2
LENS ALC ACRYL/TECN (Ophthalmic Related) ×2 IMPLANT
NEEDLE HYPO 18GX1.5 BLUNT FILL (NEEDLE) ×2 IMPLANT
PAD ARMBOARD 7.5X6 YLW CONV (MISCELLANEOUS) ×2 IMPLANT
RING MALYGIN 7.0 (MISCELLANEOUS) IMPLANT
SYR TB 1ML LL NO SAFETY (SYRINGE) ×2 IMPLANT
TAPE SURG TRANSPORE 1 IN (GAUZE/BANDAGES/DRESSINGS) ×1 IMPLANT
TAPE SURGICAL TRANSPORE 1 IN (GAUZE/BANDAGES/DRESSINGS) ×1
VISCOELASTIC ADDITIONAL (OPHTHALMIC RELATED) ×2 IMPLANT
WATER STERILE IRR 250ML POUR (IV SOLUTION) ×2 IMPLANT

## 2020-03-14 NOTE — Transfer of Care (Signed)
Immediate Anesthesia Transfer of Care Note  Patient: Olivia Ramsey  Procedure(s) Performed: CATARACT EXTRACTION PHACO AND INTRAOCULAR LENS PLACEMENT RIGHT EYE (Right Eye)  Patient Location: Short Stay  Anesthesia Type:MAC  Level of Consciousness: awake, alert  and patient cooperative  Airway & Oxygen Therapy: Patient Spontanous Breathing  Post-op Assessment: Report given to RN and Post -op Vital signs reviewed and stable  Post vital signs: Reviewed and stableSEE PACU FLOW SHEETS FOR VITAL SIGNS.  Last Vitals:  Vitals Value Taken Time  BP    Temp    Pulse    Resp    SpO2      Last Pain:  Vitals:   03/14/20 0828  TempSrc: Oral  PainSc: 0-No pain         Complications: No complications documented.

## 2020-03-14 NOTE — Anesthesia Procedure Notes (Signed)
Procedure Name: MAC Date/Time: 03/14/2020 10:12 AM Performed by: Vista Deck, CRNA Pre-anesthesia Checklist: Patient identified, Emergency Drugs available, Suction available, Timeout performed and Patient being monitored Patient Re-evaluated:Patient Re-evaluated prior to induction Oxygen Delivery Method: Nasal Cannula

## 2020-03-14 NOTE — Addendum Note (Signed)
Addendum  created 03/14/20 1125 by Franco Nones, CRNA   Charge Capture section accepted

## 2020-03-14 NOTE — Anesthesia Postprocedure Evaluation (Signed)
Anesthesia Post Note  Patient: Olivia Ramsey  Procedure(s) Performed: CATARACT EXTRACTION PHACO AND INTRAOCULAR LENS PLACEMENT RIGHT EYE (Right Eye)  Anesthesia Type: MAC   No complications documented.   Last Vitals:  Vitals:   03/14/20 0854 03/14/20 1032  BP: (!) 170/75 (!) 171/64  Pulse:  74  Resp:  18  Temp:  37 C  SpO2:  97%    Last Pain:  Vitals:   03/14/20 1032  TempSrc: Oral  PainSc: 0-No pain                 Louann Sjogren

## 2020-03-14 NOTE — Anesthesia Preprocedure Evaluation (Signed)
Anesthesia Evaluation  Patient identified by MRN, date of birth, ID band Patient awake    Reviewed: Allergy & Precautions, H&P , NPO status , Patient's Chart, lab work & pertinent test results, reviewed documented beta blocker date and time   Airway Mallampati: II  TM Distance: >3 FB Neck ROM: full    Dental no notable dental hx.    Pulmonary neg pulmonary ROS,    Pulmonary exam normal breath sounds clear to auscultation       Cardiovascular Exercise Tolerance: Good hypertension, negative cardio ROS   Rhythm:regular Rate:Normal     Neuro/Psych negative neurological ROS  negative psych ROS   GI/Hepatic negative GI ROS, Neg liver ROS,   Endo/Other  Hypothyroidism   Renal/GU negative Renal ROS  negative genitourinary   Musculoskeletal   Abdominal   Peds  Hematology  (+) Blood dyscrasia, anemia ,   Anesthesia Other Findings   Reproductive/Obstetrics negative OB ROS                             Anesthesia Physical Anesthesia Plan  ASA: II  Anesthesia Plan: MAC   Post-op Pain Management:    Induction:   PONV Risk Score and Plan: 2  Airway Management Planned:   Additional Equipment:   Intra-op Plan:   Post-operative Plan:   Informed Consent: I have reviewed the patients History and Physical, chart, labs and discussed the procedure including the risks, benefits and alternatives for the proposed anesthesia with the patient or authorized representative who has indicated his/her understanding and acceptance.     Dental Advisory Given  Plan Discussed with: CRNA  Anesthesia Plan Comments:         Anesthesia Quick Evaluation

## 2020-03-14 NOTE — Interval H&P Note (Signed)
History and Physical Interval Note:  03/14/2020 10:02 AM  Olivia Ramsey  has presented today for surgery, with the diagnosis of Nuclear sclerotic cataract - Right eye.  The various methods of treatment have been discussed with the patient and family. After consideration of risks, benefits and other options for treatment, the patient has consented to  Procedure(s) with comments: CATARACT EXTRACTION PHACO AND INTRAOCULAR LENS PLACEMENT (IOC) (Right) - right as a surgical intervention.  The patient's history has been reviewed, patient examined, no change in status, stable for surgery.  I have reviewed the patient's chart and labs.  Questions were answered to the patient's satisfaction.     [name]

## 2020-03-14 NOTE — Discharge Instructions (Signed)
Please discharge patient when stable, will follow up today with Dr. Fumio Vandam at the [facility] office immediately following discharge.  Leave shield in place until visit.  All paperwork with discharge instructions will be given at the office.  [facility] [place] Address:  [address]  [place], [place] [address]  

## 2020-03-14 NOTE — Op Note (Signed)
Date of procedure: 03/14/20  Pre-operative diagnosis:  Visually significant combined form age-related cataract, Right Eye (H25.811)  Post-operative diagnosis:  Visually significant combined form age-related cataract, Right Eye (H25.811)  Procedure: Removal of cataract via phacoemulsification and insertion of intra-ocular lens Olivia Ramsey and Olivia Ramsey [id number]  +23.5D into the capsular bag of the Right Eye  Attending surgeon: Gerda Diss. Rozena Fierro, MD, MA  Anesthesia: MAC, Topical Akten  Complications: None  Estimated Blood Loss: <5m (minimal)  Specimens: None  Implants: As above  Indications:  Visually significant age-related cataract, Right Eye  Procedure:  The patient was seen and identified in the pre-operative area. The operative eye was identified and dilated.  The operative eye was marked.  Topical anesthesia was administered to the operative eye.     The patient was then to the operative suite and placed in the supine position.  A timeout was performed confirming the patient, procedure to be performed, and all other relevant information.   The patient's face was prepped and draped in the usual fashion for intra-ocular surgery.  A lid speculum was placed into the operative eye and the surgical microscope moved into place and focused.  A superotemporal paracentesis was created using a 20 gauge paracentesis blade.  Shugarcaine was injected into the anterior chamber.  Viscoelastic was injected into the anterior chamber.  A temporal clear-corneal main wound incision was created using a 2.492mmicrokeratome.  A continuous curvilinear capsulorrhexis was initiated using an irrigating cystitome and completed using capsulorrhexis forceps.  Hydrodissection and hydrodeliniation were performed.  Viscoelastic was injected into the anterior chamber.  A phacoemulsification handpiece and a chopper as a second instrument were used to remove the nucleus and epinucleus. The irrigation/aspiration handpiece was  used to remove any remaining cortical material.   The capsular bag was reinflated with viscoelastic, checked, and found to be intact.  The intraocular lens was inserted into the capsular bag.  The irrigation/aspiration handpiece was used to remove any remaining viscoelastic.  The clear corneal wound and paracentesis wounds were then hydrated and checked with Weck-Cels to be watertight.  The lid-speculum was removed.  The drape was removed.  The patient's face was cleaned with a wet and dry 4x4.   Maxitrol was instilled in the eye. A clear shield was taped over the eye. The patient was taken to the post-operative care unit in good condition, having tolerated the procedure well.  Post-Op Instructions: [facility] and will receive all other orders and instructions.

## 2020-03-15 ENCOUNTER — Encounter (HOSPITAL_COMMUNITY): Payer: Self-pay | Admitting: Ophthalmology

## 2020-03-23 DIAGNOSIS — N3000 Acute cystitis without hematuria: Secondary | ICD-10-CM | POA: Diagnosis not present

## 2020-03-23 DIAGNOSIS — E063 Autoimmune thyroiditis: Secondary | ICD-10-CM | POA: Diagnosis not present

## 2020-03-23 DIAGNOSIS — Z6821 Body mass index (BMI) 21.0-21.9, adult: Secondary | ICD-10-CM | POA: Diagnosis not present

## 2020-03-23 DIAGNOSIS — K219 Gastro-esophageal reflux disease without esophagitis: Secondary | ICD-10-CM | POA: Diagnosis not present

## 2020-04-11 DIAGNOSIS — H52223 Regular astigmatism, bilateral: Secondary | ICD-10-CM | POA: Diagnosis not present

## 2020-04-11 DIAGNOSIS — Z0100 Encounter for examination of eyes and vision without abnormal findings: Secondary | ICD-10-CM | POA: Diagnosis not present

## 2020-04-12 DIAGNOSIS — J019 Acute sinusitis, unspecified: Secondary | ICD-10-CM | POA: Diagnosis not present

## 2020-04-12 DIAGNOSIS — H6120 Impacted cerumen, unspecified ear: Secondary | ICD-10-CM | POA: Diagnosis not present

## 2020-04-12 DIAGNOSIS — Z6821 Body mass index (BMI) 21.0-21.9, adult: Secondary | ICD-10-CM | POA: Diagnosis not present

## 2020-08-13 DIAGNOSIS — R69 Illness, unspecified: Secondary | ICD-10-CM | POA: Diagnosis not present

## 2020-11-15 DIAGNOSIS — Z0000 Encounter for general adult medical examination without abnormal findings: Secondary | ICD-10-CM | POA: Diagnosis not present

## 2020-11-15 DIAGNOSIS — Z1389 Encounter for screening for other disorder: Secondary | ICD-10-CM | POA: Diagnosis not present

## 2020-11-15 DIAGNOSIS — R7301 Impaired fasting glucose: Secondary | ICD-10-CM | POA: Diagnosis not present

## 2020-11-15 DIAGNOSIS — Z1331 Encounter for screening for depression: Secondary | ICD-10-CM | POA: Diagnosis not present

## 2020-11-15 DIAGNOSIS — E063 Autoimmune thyroiditis: Secondary | ICD-10-CM | POA: Diagnosis not present

## 2020-11-15 DIAGNOSIS — Z6820 Body mass index (BMI) 20.0-20.9, adult: Secondary | ICD-10-CM | POA: Diagnosis not present

## 2021-04-04 DIAGNOSIS — H93293 Other abnormal auditory perceptions, bilateral: Secondary | ICD-10-CM | POA: Diagnosis not present

## 2021-04-04 DIAGNOSIS — H6123 Impacted cerumen, bilateral: Secondary | ICD-10-CM | POA: Diagnosis not present

## 2021-08-31 DIAGNOSIS — J0100 Acute maxillary sinusitis, unspecified: Secondary | ICD-10-CM | POA: Diagnosis not present

## 2021-09-01 DIAGNOSIS — J0100 Acute maxillary sinusitis, unspecified: Secondary | ICD-10-CM | POA: Diagnosis not present

## 2021-09-06 DIAGNOSIS — J209 Acute bronchitis, unspecified: Secondary | ICD-10-CM | POA: Diagnosis not present

## 2021-09-06 DIAGNOSIS — J019 Acute sinusitis, unspecified: Secondary | ICD-10-CM | POA: Diagnosis not present

## 2021-09-06 DIAGNOSIS — J069 Acute upper respiratory infection, unspecified: Secondary | ICD-10-CM | POA: Diagnosis not present

## 2021-12-19 DIAGNOSIS — I10 Essential (primary) hypertension: Secondary | ICD-10-CM | POA: Diagnosis not present

## 2021-12-19 DIAGNOSIS — Z0001 Encounter for general adult medical examination with abnormal findings: Secondary | ICD-10-CM | POA: Diagnosis not present

## 2021-12-19 DIAGNOSIS — Z1331 Encounter for screening for depression: Secondary | ICD-10-CM | POA: Diagnosis not present

## 2021-12-19 DIAGNOSIS — E039 Hypothyroidism, unspecified: Secondary | ICD-10-CM | POA: Diagnosis not present

## 2022-03-23 DIAGNOSIS — E039 Hypothyroidism, unspecified: Secondary | ICD-10-CM | POA: Diagnosis not present

## 2022-03-23 DIAGNOSIS — Z0001 Encounter for general adult medical examination with abnormal findings: Secondary | ICD-10-CM | POA: Diagnosis not present

## 2022-05-03 ENCOUNTER — Inpatient Hospital Stay (HOSPITAL_COMMUNITY)
Admission: EM | Admit: 2022-05-03 | Discharge: 2022-05-09 | DRG: 522 | Disposition: A | Discharge status: Skilled Nursing Facility | Payer: Medicare HMO | Attending: Internal Medicine | Admitting: Internal Medicine

## 2022-05-03 ENCOUNTER — Other Ambulatory Visit: Payer: Self-pay

## 2022-05-03 ENCOUNTER — Emergency Department (HOSPITAL_COMMUNITY): Payer: Medicare HMO

## 2022-05-03 ENCOUNTER — Encounter (HOSPITAL_COMMUNITY): Payer: Self-pay

## 2022-05-03 DIAGNOSIS — E039 Hypothyroidism, unspecified: Secondary | ICD-10-CM | POA: Diagnosis present

## 2022-05-03 DIAGNOSIS — W010XXA Fall on same level from slipping, tripping and stumbling without subsequent striking against object, initial encounter: Secondary | ICD-10-CM | POA: Diagnosis present

## 2022-05-03 DIAGNOSIS — Z961 Presence of intraocular lens: Secondary | ICD-10-CM | POA: Diagnosis present

## 2022-05-03 DIAGNOSIS — N1831 Chronic kidney disease, stage 3a: Secondary | ICD-10-CM

## 2022-05-03 DIAGNOSIS — Z66 Do not resuscitate: Secondary | ICD-10-CM | POA: Diagnosis present

## 2022-05-03 DIAGNOSIS — S72001A Fracture of unspecified part of neck of right femur, initial encounter for closed fracture: Secondary | ICD-10-CM | POA: Diagnosis not present

## 2022-05-03 DIAGNOSIS — I129 Hypertensive chronic kidney disease with stage 1 through stage 4 chronic kidney disease, or unspecified chronic kidney disease: Secondary | ICD-10-CM | POA: Diagnosis not present

## 2022-05-03 DIAGNOSIS — R0689 Other abnormalities of breathing: Secondary | ICD-10-CM | POA: Diagnosis not present

## 2022-05-03 DIAGNOSIS — I951 Orthostatic hypotension: Secondary | ICD-10-CM | POA: Diagnosis not present

## 2022-05-03 DIAGNOSIS — Z79899 Other long term (current) drug therapy: Secondary | ICD-10-CM | POA: Diagnosis not present

## 2022-05-03 DIAGNOSIS — R509 Fever, unspecified: Secondary | ICD-10-CM | POA: Diagnosis not present

## 2022-05-03 DIAGNOSIS — Z743 Need for continuous supervision: Secondary | ICD-10-CM | POA: Diagnosis not present

## 2022-05-03 DIAGNOSIS — D696 Thrombocytopenia, unspecified: Secondary | ICD-10-CM | POA: Diagnosis not present

## 2022-05-03 DIAGNOSIS — S72141A Displaced intertrochanteric fracture of right femur, initial encounter for closed fracture: Principal | ICD-10-CM | POA: Diagnosis present

## 2022-05-03 DIAGNOSIS — M25551 Pain in right hip: Secondary | ICD-10-CM | POA: Diagnosis not present

## 2022-05-03 DIAGNOSIS — E871 Hypo-osmolality and hyponatremia: Secondary | ICD-10-CM | POA: Diagnosis not present

## 2022-05-03 DIAGNOSIS — I10 Essential (primary) hypertension: Secondary | ICD-10-CM | POA: Diagnosis present

## 2022-05-03 DIAGNOSIS — W19XXXA Unspecified fall, initial encounter: Secondary | ICD-10-CM | POA: Diagnosis not present

## 2022-05-03 DIAGNOSIS — Z79890 Hormone replacement therapy (postmenopausal): Secondary | ICD-10-CM

## 2022-05-03 DIAGNOSIS — D62 Acute posthemorrhagic anemia: Secondary | ICD-10-CM | POA: Diagnosis not present

## 2022-05-03 DIAGNOSIS — R52 Pain, unspecified: Secondary | ICD-10-CM | POA: Diagnosis not present

## 2022-05-03 DIAGNOSIS — E079 Disorder of thyroid, unspecified: Secondary | ICD-10-CM | POA: Diagnosis present

## 2022-05-03 HISTORY — DX: Gastro-esophageal reflux disease without esophagitis: K21.9

## 2022-05-03 LAB — CBC WITH DIFFERENTIAL/PLATELET
Abs Immature Granulocytes: 0.04 10*3/uL (ref 0.00–0.07)
Basophils Absolute: 0 10*3/uL (ref 0.0–0.1)
Basophils Relative: 0 %
Eosinophils Absolute: 0 10*3/uL (ref 0.0–0.5)
Eosinophils Relative: 0 %
HCT: 33.4 % — ABNORMAL LOW (ref 36.0–46.0)
Hemoglobin: 11.2 g/dL — ABNORMAL LOW (ref 12.0–15.0)
Immature Granulocytes: 0 %
Lymphocytes Relative: 13 %
Lymphs Abs: 1.3 10*3/uL (ref 0.7–4.0)
MCH: 30.8 pg (ref 26.0–34.0)
MCHC: 33.5 g/dL (ref 30.0–36.0)
MCV: 91.8 fL (ref 80.0–100.0)
Monocytes Absolute: 0.5 10*3/uL (ref 0.1–1.0)
Monocytes Relative: 5 %
Neutro Abs: 8.2 10*3/uL — ABNORMAL HIGH (ref 1.7–7.7)
Neutrophils Relative %: 82 %
Platelets: 150 10*3/uL (ref 150–400)
RBC: 3.64 MIL/uL — ABNORMAL LOW (ref 3.87–5.11)
RDW: 12.4 % (ref 11.5–15.5)
WBC: 10.1 10*3/uL (ref 4.0–10.5)
nRBC: 0 % (ref 0.0–0.2)

## 2022-05-03 LAB — BASIC METABOLIC PANEL
Anion gap: 8 (ref 5–15)
BUN: 24 mg/dL — ABNORMAL HIGH (ref 8–23)
CO2: 25 mmol/L (ref 22–32)
Calcium: 8.9 mg/dL (ref 8.9–10.3)
Chloride: 101 mmol/L (ref 98–111)
Creatinine, Ser: 1.15 mg/dL — ABNORMAL HIGH (ref 0.44–1.00)
GFR, Estimated: 46 mL/min — ABNORMAL LOW (ref >60)
Glucose, Bld: 160 mg/dL — ABNORMAL HIGH (ref 70–99)
Potassium: 3.9 mmol/L (ref 3.5–5.1)
Sodium: 134 mmol/L — ABNORMAL LOW (ref 135–145)

## 2022-05-03 MED ORDER — OXYCODONE HCL 5 MG PO TABS: 5.0000 mg | ORAL_TABLET | ORAL | Status: DC | PRN

## 2022-05-03 MED ORDER — SODIUM CHLORIDE 0.9 % IV SOLN: INTRAVENOUS | Status: DC

## 2022-05-03 MED ORDER — HYDRALAZINE HCL 20 MG/ML IJ SOLN
10.0000 mg | Freq: Four times a day (QID) | INTRAMUSCULAR | Status: DC | PRN
  Administered 2022-05-04: 10 mg via INTRAVENOUS
  Filled 2022-05-03 (×4): qty 0.5

## 2022-05-03 MED ORDER — FENTANYL CITRATE PF 50 MCG/ML IJ SOSY
50.0000 ug | PREFILLED_SYRINGE | Freq: Once | INTRAMUSCULAR | Status: AC
  Administered 2022-05-03: 50 ug via INTRAVENOUS
  Filled 2022-05-03: qty 1

## 2022-05-03 MED ORDER — RISAQUAD PO CAPS
1.0000 | ORAL_CAPSULE | Freq: Every day | ORAL | Status: DC
  Administered 2022-05-04 – 2022-05-09 (×5): 1 via ORAL
  Filled 2022-05-03 (×5): qty 1

## 2022-05-03 MED ORDER — LEVOTHYROXINE SODIUM 50 MCG PO TABS
50.0000 ug | ORAL_TABLET | Freq: Every day | ORAL | Status: DC
  Administered 2022-05-04 – 2022-05-09 (×6): 50 ug via ORAL
  Filled 2022-05-03 (×6): qty 1

## 2022-05-03 MED ORDER — ALBUTEROL SULFATE (2.5 MG/3ML) 0.083% IN NEBU
2.5000 mg | INHALATION_SOLUTION | Freq: Four times a day (QID) | RESPIRATORY_TRACT | Status: DC
  Filled 2022-05-03: qty 3

## 2022-05-03 MED ORDER — ONDANSETRON HCL 4 MG/2ML IJ SOLN
4.0000 mg | Freq: Once | INTRAMUSCULAR | Status: AC
  Administered 2022-05-03: 4 mg via INTRAVENOUS
  Filled 2022-05-03: qty 2

## 2022-05-03 MED ORDER — POLYETHYLENE GLYCOL 3350 17 G PO PACK
17.0000 g | PACK | Freq: Every day | ORAL | Status: DC | PRN
Start: 2022-05-03 — End: 2022-05-04

## 2022-05-03 MED ORDER — LOSARTAN POTASSIUM 50 MG PO TABS
100.0000 mg | ORAL_TABLET | Freq: Every day | ORAL | Status: DC
  Administered 2022-05-04 – 2022-05-06 (×3): 100 mg via ORAL
  Filled 2022-05-03: qty 2
  Filled 2022-05-03 (×2): qty 4
  Filled 2022-05-03: qty 2

## 2022-05-03 MED ORDER — VITAMIN D 25 MCG (1000 UNIT) PO TABS
1000.0000 [IU] | ORAL_TABLET | Freq: Every day | ORAL | Status: DC
  Administered 2022-05-04 – 2022-05-09 (×5): 1000 [IU] via ORAL
  Filled 2022-05-03 (×5): qty 1

## 2022-05-03 MED ORDER — MORPHINE SULFATE (PF) 2 MG/ML IV SOLN
1.0000 mg | INTRAVENOUS | Status: DC | PRN
  Administered 2022-05-04 (×2): 1 mg via INTRAVENOUS
  Filled 2022-05-03 (×2): qty 1

## 2022-05-03 MED ORDER — SENNA 8.6 MG PO TABS
1.0000 | ORAL_TABLET | Freq: Two times a day (BID) | ORAL | Status: DC
  Administered 2022-05-04 – 2022-05-09 (×10): 8.6 mg via ORAL
  Filled 2022-05-03 (×11): qty 1

## 2022-05-03 MED ORDER — ENOXAPARIN SODIUM 40 MG/0.4ML IJ SOSY: 40.0000 mg | PREFILLED_SYRINGE | INTRAMUSCULAR | Status: DC

## 2022-05-03 MED ORDER — HYDROMORPHONE HCL 1 MG/ML IJ SOLN
1.0000 mg | Freq: Once | INTRAMUSCULAR | Status: AC
  Administered 2022-05-03: 1 mg via INTRAVENOUS
  Filled 2022-05-03: qty 1

## 2022-05-03 MED ORDER — ONDANSETRON HCL 4 MG/2ML IJ SOLN
4.0000 mg | Freq: Four times a day (QID) | INTRAMUSCULAR | Status: DC | PRN
  Administered 2022-05-04 – 2022-05-05 (×2): 4 mg via INTRAVENOUS
  Filled 2022-05-03 (×2): qty 2

## 2022-05-03 MED ORDER — ACETAMINOPHEN 650 MG RE SUPP: 650.0000 mg | Freq: Four times a day (QID) | RECTAL | Status: DC | PRN

## 2022-05-03 MED ORDER — METHOCARBAMOL 1000 MG/10ML IJ SOLN
500.0000 mg | Freq: Four times a day (QID) | INTRAVENOUS | Status: DC | PRN
  Filled 2022-05-03: qty 5

## 2022-05-03 MED ORDER — ACETAMINOPHEN 325 MG PO TABS
650.0000 mg | ORAL_TABLET | Freq: Four times a day (QID) | ORAL | Status: DC | PRN
  Administered 2022-05-06 – 2022-05-08 (×3): 650 mg via ORAL
  Filled 2022-05-03 (×3): qty 2

## 2022-05-03 NOTE — ED Provider Notes (Signed)
[facility] EMERGENCY DEPARTMENT Provider Note   CSN: [id number] Arrival date & time: 05/03/22  1331     History Chief Complaint  Patient presents with   Hip Pain    Olivia Ramsey is a 88 y.o. female patient who presents to the emerged department today for further evaluation of right hip pain after mechanical trip and fall.  This occurred prior to arrival.  Patient states that she was helping her daughter move on her back patio when she tripped and fell onto her right hip onto the concrete pavers.  She did not hit her head or lose consciousness.  She also did hit her right elbow.   Hip Pain       Home Medications Prior to Admission medications   Medication Sig Start Date End Date Taking? Authorizing Provider  levothyroxine (SYNTHROID) 50 MCG tablet Take by mouth. 02/21/21  Yes [provider]  acidophilus (RISAQUAD) CAPS capsule Take 1 capsule by mouth daily.    [provider]  cholecalciferol (VITAMIN D3) 25 MCG (1000 UNIT) tablet Take 1,000 Units by mouth daily.    [provider]  levothyroxine (SYNTHROID) 50 MCG tablet Take 50 mcg by mouth daily before breakfast.  09/01/14   [provider]  losartan (COZAAR) 100 MG tablet Take 100 mg by mouth daily.  09/01/14   [provider]      Allergies    Patient has no known allergies.    Review of Systems   Review of Systems  All other systems reviewed and are negative.   Physical Exam Updated Vital Signs BP (!) 165/52   Pulse 68   Resp 18   Ht 5\' 6"  (1.676 m)   Wt 58.1 kg   SpO2 98%   BMI 20.66 kg/m  Physical Exam Vitals and nursing note reviewed.  Constitutional:      Appearance: Normal appearance.  HENT:     Head: Normocephalic and atraumatic.  Eyes:     General:        Right eye: No discharge.        Left eye: No discharge.     Conjunctiva/sclera: Conjunctivae normal.  Pulmonary:     Effort: Pulmonary effort is normal.  Musculoskeletal:     Comments: Right leg  is externally rotated and mildly shortened.  There is tenderness over the right lateral hip.  She has a 2+ dorsalis pedis pulse on the right.  She is neurovascularly intact distal to the hip and the foot.  Good cap refill.  Skin:    General: Skin is warm and dry.     Findings: No rash.  Neurological:     General: No focal deficit present.     Mental Status: She is alert.  Psychiatric:        Mood and Affect: Mood normal.        Behavior: Behavior normal.     ED Results / Procedures / Treatments   Labs (all labs ordered are listed, but only abnormal results are displayed) Labs Reviewed  CBC WITH DIFFERENTIAL/PLATELET - Abnormal; Notable for the following components:      Result Value   RBC 3.64 (*)    Hemoglobin 11.2 (*)    HCT 33.4 (*)    Neutro Abs 8.2 (*)    All other components within normal limits  BASIC METABOLIC PANEL - Abnormal; Notable for the following components:   Sodium 134 (*)    Glucose, Bld 160 (*)    BUN 24 (*)  Creatinine, Ser 1.15 (*)    GFR, Estimated 46 (*)    All other components within normal limits    EKG None  Radiology CT Hip Right Wo Contrast  Result Date: 05/03/2022 CLINICAL DATA:  Right hip pain after fall EXAM: CT OF THE RIGHT HIP WITHOUT CONTRAST TECHNIQUE: Multidetector CT imaging of the right hip was performed according to the standard protocol. Multiplanar CT image reconstructions were also generated. RADIATION DOSE REDUCTION: This exam was performed according to the departmental dose-optimization program which includes automated exposure control, adjustment of the mA and/or kV according to patient size and/or use of iterative reconstruction technique. COMPARISON:  Radiographs earlier today FINDINGS: Bones/Joint/Cartilage Redemonstrated mildly-comminuted intertrochanteric right femur fracture. There is apex anterior angulation. Normal alignment of the femoral head within the acetabulum. Ligaments Suboptimally assessed by CT. Muscles and  Tendons Unremarkable. Soft tissues Small amount of edema about the intertrochanteric fracture. IMPRESSION: Intertrochanteric right femur fracture. Electronically Signed   By: Minerva Fester M.D.   On: 05/03/2022 17:24   DG Hip Unilat W or Wo Pelvis 2-3 Views Right  Result Date: 05/03/2022 CLINICAL DATA:  Trauma, fall EXAM: DG HIP (WITH OR WITHOUT PELVIS) 2-3V RIGHT COMPARISON:  None Available. FINDINGS: Intertrochanteric fracture is seen in the neck of right femur. There is offset in alignment of medial cortical margin. There is no dislocation. IMPRESSION: Intertrochanteric fracture of proximal right femur. Electronically Signed   By: Ernie Avena M.D.   On: 05/03/2022 15:05    Procedures Procedures    Medications Ordered in ED Medications  HYDROmorphone (DILAUDID) injection 1 mg (1 mg Intravenous Given 05/03/22 1423)  ondansetron (ZOFRAN) injection 4 mg (4 mg Intravenous Given 05/03/22 1428)  ondansetron (ZOFRAN) injection 4 mg (4 mg Intravenous Given 05/03/22 1719)  fentaNYL (SUBLIMAZE) injection 50 mcg (50 mcg Intravenous Given 05/03/22 1719)    ED Course/ Medical Decision Making/ A&P Clinical Course as of 05/03/22 1733  Thu May 03, 2022  1627 I spoke with Dr. Everardo Pacific with orthopedics who recommends getting a CT scan and admitting to the hospital service over at [facility].  Plan will be to get surgery tomorrow. [CF]  1627 CBC with Differential(!) No evidence of leukocytosis.  Mild anemia but improved from previous results. [CF]  1628 Basic metabolic panel(!) Mild evidence of hyponatremia and slightly elevated creatinine in comparison to previous. [CF]  1629 DG Hip Unilat W or Wo Pelvis 2-3 Views Right I personally ordered and interpreted a hip x-ray with right hip which shows fracture.  I do agree with the radiologist interpretation. [CF]  1726 I spoke with Dahal with [facility] who agrees to admit the patient at [facility]. [CF]    Clinical Course User Index [CF] Teressa Lower,  PA-C                           Medical Decision Making Olivia Ramsey is a 88 y.o. female patient who presents to the emergency department with right hip pain after mechanical trip and fall.  There is evidence of hip fracture considering that the patient's right leg is externally rotated and mildly shortened.  We will get imaging to confirm or deny this.  Patient in a lot of pain.  I will also get some basic labs in addition to pain medication.  There is evidence of hip fracture on imaging.  I spoke with Dr. Everardo Pacific with orthopedics which is highlighted in ED course.  He recommends admission to [facility].  I will work on getting her admitted to the hospitalist service.  Pain is currently under control.   Amount and/or Complexity of Data Reviewed Labs: ordered. Decision-making details documented in ED Course. Radiology: ordered. Decision-making details documented in ED Course.  Risk Prescription drug management. Decision regarding hospitalization.   Final Clinical Impression(s) / ED Diagnoses Final diagnoses:  Closed fracture of right hip, initial encounter [facility])    Rx / DC Orders ED Discharge Orders     None         Teressa Lower, [place] 05/03/22 1733    Terald Sleeper, MD 05/04/22 [phone]

## 2022-05-03 NOTE — ED Notes (Signed)
Tried to get pts pants off for the doctor to examen injuries. Pt states "it hurts just cut them off." Pants cut off. Charge nurse notified

## 2022-05-03 NOTE — ED Notes (Signed)
Verbal consent for MSE. No signature pad available.  ?

## 2022-05-03 NOTE — ED Triage Notes (Signed)
Patient to ED with complaints of right hip pain s/p mechanical fall. Noted with outward rotation. +pedal pulses. Noted with swelling, abrasion to right knee and two skin tears with bruising to right arm. Fentanyl and Zofran administered by EMS.

## 2022-05-03 NOTE — H&P (Signed)
[facility] History and Physical  Olivia Ramsey XBD:[id number] DOB: 08-27-1931 DOA: 05/03/2022 PCP: Elfredia Nevins, MD  Admitted from: Home Chief Complaint: Fall, hip pain  History of Present Illness: Olivia Ramsey is a 88 y.o. female with PMH significant for HTN, hypothyroidism Patient was brought to the ED today after mechanical trip and fall leading to right hip pain.  Patient apparently was helping her daughter move on her back patio, when she tripped and fell onto her right hip onto the concrete pavers.  She did not hit her head or lose consciousness.  She also hit her right elbow. On arrival, patient was afebrile, blood pressure was elevated to 187/66, breathing room air Labs with sodium 134, glucose 160, BUN/creatinine 24/1.5, WC count 10.1, hemoglobin 11.2, platelet 150 Right hip x-ray showed Intertrochanteric fracture of proximal right femur. ED physician discussed with orthopedics on-call Dr. Everardo Pacific.  He recommended transfer to [facility] for potential ORIF tomorrow. Hospitalist service was consulted for inpatient admission and management.  At the time of my evaluation, patient was lying in bed.  Not in distress.  No new symptoms.  Pain controlled.  Daughters at bedside.  Review of Systems:  All systems were reviewed and were negative unless otherwise mentioned in the HPI   Past medical history: Past Medical History:  Diagnosis Date   Hypertension    Hypothyroidism    Thyroid disease     Past surgical history: Past Surgical History:  Procedure Laterality Date   BREAST SURGERY Right    cyst   CATARACT EXTRACTION W/PHACO Left 02/29/2020   Procedure: CATARACT EXTRACTION PHACO AND INTRAOCULAR LENS PLACEMENT (IOC);  Surgeon: [name], MD;  Location: [facility];  Service: Ophthalmology;  Laterality: Left;  CDE: 10.16   CATARACT EXTRACTION W/PHACO Right 03/14/2020   Procedure: CATARACT EXTRACTION PHACO AND INTRAOCULAR LENS PLACEMENT RIGHT EYE;  Surgeon: [name],  MD;  Location: [facility];  Service: Ophthalmology;  Laterality: Right;  CDE: 8.59    Social History:  reports that she has never smoked. She has never used smokeless tobacco. She reports that she does not drink alcohol and does not use drugs.  Allergies:  No Known Allergies Patient has no known allergies.   Family history:  Family History  Problem Relation Age of Onset   Stroke Father      Home Meds: Prior to Admission medications   Medication Sig Start Date End Date Taking? Authorizing Provider  acidophilus (RISAQUAD) CAPS capsule Take 1 capsule by mouth daily.    [provider]  cholecalciferol (VITAMIN D3) 25 MCG (1000 UNIT) tablet Take 1,000 Units by mouth daily.    [provider]  levothyroxine (SYNTHROID) 50 MCG tablet Take 50 mcg by mouth daily before breakfast.  09/01/14   [provider]  losartan (COZAAR) 100 MG tablet Take 100 mg by mouth daily.  09/01/14   [provider]    Physical Exam: Vitals:   05/03/22 1342 05/03/22 1355 05/03/22 1434 05/03/22 1604  BP:  (!) 187/66 (!) 165/53 (!) 165/52  Pulse:  74 76 68  Resp:  18 17 18   SpO2:  99% 93% 98%  Weight: 58.1 kg     Height: 5\' 6"  (1.676 m)      Wt Readings from Last 3 Encounters:  05/03/22 58.1 kg  03/14/20 59.7 kg  02/29/20 59.7 kg   Body mass index is 20.66 kg/m.  General exam: Pleasant, elderly Caucasian female.  Not in physical pain at the time of my  evaluation Skin: No rashes, lesions or ulcers. HEENT: Atraumatic, normocephalic, no obvious bleeding Lungs: Clear to auscultation bilaterally CVS: Regular rate and rhythm, no murmur GI/Abd soft, nontender, nondistended, bowel sound present CNS: Alert, awake, oriented x3 Psychiatry: Mood appropriate Extremities: No pedal edema, no calf tenderness     Consult Orders  (From admission, onward)           Start     Ordered   05/03/22 1702  Consult to hospitalist  Once       Provider:  (Not yet assigned)   Question Answer Comment  Place call to: [facility]   Reason for Consult Admit      05/03/22 1701            Labs on Admission:   CBC: Recent Labs  Lab 05/03/22 1524  WBC 10.1  NEUTROABS 8.2*  HGB 11.2*  HCT 33.4*  MCV 91.8  PLT 150    Basic Metabolic Panel: Recent Labs  Lab 05/03/22 1524  NA 134*  K 3.9  CL 101  CO2 25  GLUCOSE 160*  BUN 24*  CREATININE 1.15*  CALCIUM 8.9    Liver Function Tests: No results for input(s): "AST", "ALT", "ALKPHOS", "BILITOT", "PROT", "ALBUMIN" in the last 168 hours. No results for input(s): "LIPASE", "AMYLASE" in the last 168 hours. No results for input(s): "AMMONIA" in the last 168 hours.  Cardiac Enzymes: No results for input(s): "CKTOTAL", "CKMB", "CKMBINDEX", "TROPONINI" in the last 168 hours.  BNP (last 3 results) No results for input(s): "BNP" in the last 8760 hours.  ProBNP (last 3 results) No results for input(s): "PROBNP" in the last 8760 hours.  CBG: No results for input(s): "GLUCAP" in the last 168 hours.  Lipase     Component Value Date/Time   LIPASE 16 09/05/2014 0009     Urinalysis No results found for: "COLORURINE", "APPEARANCEUR", "LABSPEC", "PHURINE", "GLUCOSEU", "HGBUR", "BILIRUBINUR", "KETONESUR", "PROTEINUR", "UROBILINOGEN", "NITRITE", "LEUKOCYTESUR"   Drugs of Abuse  No results found for: "LABOPIA", "COCAINSCRNUR", "LABBENZ", "AMPHETMU", "THCU", "LABBARB"    Radiological Exams on Admission: CT Hip Right Wo Contrast  Result Date: 05/03/2022 CLINICAL DATA:  Right hip pain after fall EXAM: CT OF THE RIGHT HIP WITHOUT CONTRAST TECHNIQUE: Multidetector CT imaging of the right hip was performed according to the standard protocol. Multiplanar CT image reconstructions were also generated. RADIATION DOSE REDUCTION: This exam was performed according to the departmental dose-optimization program which includes automated exposure control, adjustment of the mA and/or kV according to patient size  and/or use of iterative reconstruction technique. COMPARISON:  Radiographs earlier today FINDINGS: Bones/Joint/Cartilage Redemonstrated mildly-comminuted intertrochanteric right femur fracture. There is apex anterior angulation. Normal alignment of the femoral head within the acetabulum. Ligaments Suboptimally assessed by CT. Muscles and Tendons Unremarkable. Soft tissues Small amount of edema about the intertrochanteric fracture. IMPRESSION: Intertrochanteric right femur fracture. Electronically Signed   By: Minerva Fester M.D.   On: 05/03/2022 17:24   DG Hip Unilat W or Wo Pelvis 2-3 Views Right  Result Date: 05/03/2022 CLINICAL DATA:  Trauma, fall EXAM: DG HIP (WITH OR WITHOUT PELVIS) 2-3V RIGHT COMPARISON:  None Available. FINDINGS: Intertrochanteric fracture is seen in the neck of right femur. There is offset in alignment of medial cortical margin. There is no dislocation. IMPRESSION: Intertrochanteric fracture of proximal right femur. Electronically Signed   By: Ernie Avena M.D.   On: 05/03/2022 15:05     ------------------------------------------------------------------------------------------------------ Assessment/Plan: Principal Problem:   Closed right hip fracture (HCC)  Acute right hip intertrochanteric  fracture Secondary to mechanical fall Imagings as above EDP discussed with orthopedic surgeon Dr. Everardo Pacific. He recommended transfer to [facility] for potential ORIF tomorrow. We will put the [facility]. Pain meds ordered. Unclear what time of the day the surgery will be.  We will skip Lovenox for tonight.  Will order to start tomorrow night.    HTN Blood pressure on arrival elevated to 180s.  Probably because of pain PTA on losartan 100 mg daily.  Hydralazine IV as needed  Hypothyroidism Resume Synthroid  CKD3a Creatinine at baseline, when compared to 2015. Recent Labs    05/03/22 1524  BUN 24*  CREATININE 1.15*    Mobility -PT eval  postprocedure  Goals of care - -  Code Status: Prior DNR/DNI.  Confirmed with patient and daughter at bedside.  Diet: Cardiac diet.  N.p.o. after midnight for potential surgery tomorrow Diet Order     None      DVT prophylaxis: Lovenox postprocedure   Antimicrobials: None Fluid: NS at 75 mill per hour Consultants: Orthopedics Family Communication: Daughters at bedside Dispo: The patient is from: Home              Anticipated d/c is to: Pending clinical course, needs PT eval postprocedure  ------------------------------------------------------------------------------------- Severity of Illness: The appropriate patient status for this patient is OBSERVATION. Observation status is judged to be reasonable and necessary in order to provide the required intensity of service to ensure the patient's safety. The patient's presenting symptoms, physical exam findings, and initial radiographic and laboratory data in the context of their medical condition is felt to place them at decreased risk for further clinical deterioration. Furthermore, it is anticipated that the patient will be medically stable for discharge from the hospital within 2 midnights of admission.   Signed, Lorin Glass, MD [facility] 05/03/2022

## 2022-05-04 ENCOUNTER — Observation Stay (HOSPITAL_COMMUNITY): Payer: Medicare HMO

## 2022-05-04 DIAGNOSIS — I951 Orthostatic hypotension: Secondary | ICD-10-CM | POA: Diagnosis not present

## 2022-05-04 DIAGNOSIS — Z961 Presence of intraocular lens: Secondary | ICD-10-CM | POA: Diagnosis not present

## 2022-05-04 DIAGNOSIS — N289 Disorder of kidney and ureter, unspecified: Secondary | ICD-10-CM | POA: Diagnosis not present

## 2022-05-04 DIAGNOSIS — R509 Fever, unspecified: Secondary | ICD-10-CM | POA: Diagnosis not present

## 2022-05-04 DIAGNOSIS — I10 Essential (primary) hypertension: Secondary | ICD-10-CM | POA: Diagnosis not present

## 2022-05-04 DIAGNOSIS — E871 Hypo-osmolality and hyponatremia: Secondary | ICD-10-CM | POA: Diagnosis not present

## 2022-05-04 DIAGNOSIS — Z471 Aftercare following joint replacement surgery: Secondary | ICD-10-CM | POA: Diagnosis not present

## 2022-05-04 DIAGNOSIS — E079 Disorder of thyroid, unspecified: Secondary | ICD-10-CM | POA: Diagnosis not present

## 2022-05-04 DIAGNOSIS — N1831 Chronic kidney disease, stage 3a: Secondary | ICD-10-CM

## 2022-05-04 DIAGNOSIS — Z96641 Presence of right artificial hip joint: Secondary | ICD-10-CM | POA: Diagnosis not present

## 2022-05-04 DIAGNOSIS — D62 Acute posthemorrhagic anemia: Secondary | ICD-10-CM | POA: Diagnosis not present

## 2022-05-04 DIAGNOSIS — Z66 Do not resuscitate: Secondary | ICD-10-CM | POA: Diagnosis not present

## 2022-05-04 DIAGNOSIS — S72141A Displaced intertrochanteric fracture of right femur, initial encounter for closed fracture: Secondary | ICD-10-CM | POA: Diagnosis not present

## 2022-05-04 DIAGNOSIS — S72041A Displaced fracture of base of neck of right femur, initial encounter for closed fracture: Secondary | ICD-10-CM | POA: Diagnosis not present

## 2022-05-04 DIAGNOSIS — Z79899 Other long term (current) drug therapy: Secondary | ICD-10-CM | POA: Diagnosis not present

## 2022-05-04 DIAGNOSIS — E039 Hypothyroidism, unspecified: Secondary | ICD-10-CM | POA: Diagnosis not present

## 2022-05-04 DIAGNOSIS — I129 Hypertensive chronic kidney disease with stage 1 through stage 4 chronic kidney disease, or unspecified chronic kidney disease: Secondary | ICD-10-CM | POA: Diagnosis not present

## 2022-05-04 DIAGNOSIS — Z79890 Hormone replacement therapy (postmenopausal): Secondary | ICD-10-CM | POA: Diagnosis not present

## 2022-05-04 DIAGNOSIS — D696 Thrombocytopenia, unspecified: Secondary | ICD-10-CM | POA: Diagnosis not present

## 2022-05-04 DIAGNOSIS — S72001A Fracture of unspecified part of neck of right femur, initial encounter for closed fracture: Secondary | ICD-10-CM | POA: Diagnosis not present

## 2022-05-04 DIAGNOSIS — D638 Anemia in other chronic diseases classified elsewhere: Secondary | ICD-10-CM | POA: Diagnosis not present

## 2022-05-04 DIAGNOSIS — W010XXA Fall on same level from slipping, tripping and stumbling without subsequent striking against object, initial encounter: Secondary | ICD-10-CM | POA: Diagnosis not present

## 2022-05-04 LAB — SURGICAL PCR SCREEN
MRSA, PCR: NEGATIVE
Staphylococcus aureus: NEGATIVE

## 2022-05-04 LAB — CBC
HCT: 31.1 % — ABNORMAL LOW (ref 36.0–46.0)
Hemoglobin: 10.9 g/dL — ABNORMAL LOW (ref 12.0–15.0)
MCH: 31.1 pg (ref 26.0–34.0)
MCHC: 35 g/dL (ref 30.0–36.0)
MCV: 88.6 fL (ref 80.0–100.0)
Platelets: 158 10*3/uL (ref 150–400)
RBC: 3.51 MIL/uL — ABNORMAL LOW (ref 3.87–5.11)
RDW: 12.3 % (ref 11.5–15.5)
WBC: 8.2 10*3/uL (ref 4.0–10.5)
nRBC: 0 % (ref 0.0–0.2)

## 2022-05-04 LAB — BASIC METABOLIC PANEL
Anion gap: 8 (ref 5–15)
BUN: 19 mg/dL (ref 8–23)
CO2: 22 mmol/L (ref 22–32)
Calcium: 8.8 mg/dL — ABNORMAL LOW (ref 8.9–10.3)
Chloride: 101 mmol/L (ref 98–111)
Creatinine, Ser: 1.08 mg/dL — ABNORMAL HIGH (ref 0.44–1.00)
GFR, Estimated: 49 mL/min — ABNORMAL LOW (ref >60)
Glucose, Bld: 129 mg/dL — ABNORMAL HIGH (ref 70–99)
Potassium: 4.2 mmol/L (ref 3.5–5.1)
Sodium: 131 mmol/L — ABNORMAL LOW (ref 135–145)

## 2022-05-04 MED ORDER — ALBUTEROL SULFATE (2.5 MG/3ML) 0.083% IN NEBU: 2.5000 mg | INHALATION_SOLUTION | Freq: Four times a day (QID) | RESPIRATORY_TRACT | Status: DC | PRN

## 2022-05-04 MED ORDER — MUPIROCIN 2 % EX OINT
1.0000 | TOPICAL_OINTMENT | Freq: Two times a day (BID) | CUTANEOUS | Status: AC
  Administered 2022-05-04 – 2022-05-08 (×7): 1 via NASAL
  Filled 2022-05-04 (×4): qty 22

## 2022-05-04 MED ORDER — POLYETHYLENE GLYCOL 3350 17 G PO PACK
17.0000 g | PACK | Freq: Every day | ORAL | Status: DC
  Administered 2022-05-06 – 2022-05-09 (×4): 17 g via ORAL
  Filled 2022-05-04 (×4): qty 1

## 2022-05-04 MED ORDER — MORPHINE SULFATE (PF) 2 MG/ML IV SOLN
2.0000 mg | INTRAVENOUS | Status: DC | PRN
  Administered 2022-05-04 – 2022-05-07 (×7): 2 mg via INTRAVENOUS
  Filled 2022-05-04 (×7): qty 1

## 2022-05-04 MED ORDER — ENOXAPARIN SODIUM 40 MG/0.4ML IJ SOSY: 40.0000 mg | PREFILLED_SYRINGE | INTRAMUSCULAR | Status: DC

## 2022-05-04 NOTE — H&P (View-Only) (Signed)
ORTHOPAEDIC CONSULTATION  REQUESTING PHYSICIAN: Adhikari, Amrit, MD  Chief Complaint: R hip fx  HPI: Olivia Ramsey is a 88 y.o. female who is a community ambulator without assistive device who sustained a fall yesterday while helping move some boxes.  She states she tripped over the boxes landing to her right side.  She was unable to weight-bear after the fall.  She localizes pain to the right hip area.  She has some abrasions to her right elbow but he was able to move her upper extremity without any discomfort.  She denies distal numbness and tingling.  She denies pain in other joints or extremities.  Past Medical History:  Diagnosis Date   Hypertension    Hypothyroidism    Thyroid disease    Past Surgical History:  Procedure Laterality Date   BREAST SURGERY Right    cyst   CATARACT EXTRACTION W/PHACO Left 02/29/2020   Procedure: CATARACT EXTRACTION PHACO AND INTRAOCULAR LENS PLACEMENT (IOC);  Surgeon: Wrzosek, James, MD;  Location: [facility];  Service: Ophthalmology;  Laterality: Left;  CDE: 10.16   CATARACT EXTRACTION W/PHACO Right 03/14/2020   Procedure: CATARACT EXTRACTION PHACO AND INTRAOCULAR LENS PLACEMENT RIGHT EYE;  Surgeon: Wrzosek, James, MD;  Location: [facility];  Service: Ophthalmology;  Laterality: Right;  CDE: 8.59   Social History   Socioeconomic History   Marital status: Widowed    Spouse name: Not on file   Number of children: Not on file   Years of education: Not on file   Highest education level: Not on file  Occupational History   Not on file  Tobacco Use   Smoking status: Never   Smokeless tobacco: Never  Substance and Sexual Activity   Alcohol use: No   Drug use: No   Sexual activity: Not on file  Other Topics Concern   Not on file  Social History Narrative   Not on file   Social Determinants of Health   Financial Resource Strain: Not on file  Food Insecurity: Not on file  Transportation Needs: Not on file  Physical Activity: Not on file  Stress: Not  on file  Social Connections: Not on file   Family History  Problem Relation Age of Onset   Stroke Father    No Known Allergies   Positive ROS: All other systems have been reviewed and were otherwise negative with the exception of those mentioned in the HPI and as above.  Physical Exam: General: Alert, no acute distress Cardiovascular: No pedal edema Respiratory: No cyanosis, no use of accessory musculature Skin: No lesions in the area of chief complaint Neurologic: Sensation intact distally Psychiatric: Patient is competent for consent with normal mood and affect  MUSCULOSKELETAL:  LLE No traumatic wounds, ecchymosis, or rash  Nontender  No groin pain with log roll  No knee or ankle effusion  Sens DPN, SPN, TN intact  Motor EHL, ext, flex 5/5  DP 2+, PT 2+, No significant edema  RLE No traumatic wounds, ecchymosis, or rash  Nontender  No knee or ankle effusion  Sens DPN, SPN, TN intact  Motor EHL, ext, flex 5/5  DP 2+, PT 2+, No significant edema   IMAGING: X-rays and CT scan reviewed demonstrate a comminuted basicervical and transcervical femoral neck fracture  Assessment: Principal Problem:   Closed right hip fracture (HCC) Active Problems:   Thyroid disease   Hypertension   Chronic kidney disease, stage 3a (HCC)  Right displaced basicervical femoral neck fracture with transcervical extension  Plan: Discussed   with patient and family today findings of her x-ray and CT scan demonstrating comminuted femoral neck fracture with extension transcervical to basicervical.  This images were reviewed with Dr. Everardo Pacific who was on-call.  Had concern for nonunion or malunion with open reduction internal fixation with a cephalomedullary nail device.  Ultimately feel more predictable return to function with arthroplasty.  Given the patient's high baseline function think she would do well with a total hip arthroplasty.  We will plan for cemented stem given the extension to the  basicervical neck.  Possible cerclage cables if needed.  The risks benefits and alternatives were discussed with the patient including but not limited to the risks of nonoperative treatment, versus surgical intervention including infection, bleeding, nerve injury, periprosthetic fracture, the need for revision surgery, dislocation, leg length discrepancy, blood clots, cardiopulmonary complications, morbidity, mortality, among others, and they were willing to proceed.     Joen Laura, MD  Contact information:   [id number] 7am-5pm epic message Dr. Blanchie Dessert, or call office for patient follow up: [phone] After hours and holidays please check [email or web address] for group call information for [facility]

## 2022-05-04 NOTE — Progress Notes (Signed)
PROGRESS NOTE  Olivia Ramsey  WEX:[id number] DOB: 1930/11/24 DOA: 05/03/2022 PCP: Elfredia Nevins, MD   Brief Narrative:  Patient is a 88 year old female with history of hypertension, hypothyroidism who was brought to the emergency department after a mechanical fall at home followed by right hip pain.  No history of head injury or loss of consciousness.  On presentation she was hypertensive.  Right hip x-ray showed intertrochanteric fracture of proximal right femur.  Orthopedics consulted.  Plan for cemented hip hemiarthroplasty vs total hip arthroplasty today.  Assessment & Plan:  Principal Problem:   Closed right hip fracture (HCC) Active Problems:   Thyroid disease   Hypertension  Closed right hip fracture: Mechanical fall at home.  Usually physically active, ambulates without any problem.  Right hip x-ray showed intertrochanteric fracture of proximal right femur.  Orthopedics consulted.  Plan for cemented hip hemiarthroplasty vs total hip arthroplasty today. Continue supportive care, pain management.  PT/OT evaluation after surgery. SCD for DVT prophylaxis until surgery  Hypertension: Hypertensive on presentation.  Takes losartan at home.  Currently blood pressure stable.  Continue as needed medications for severe hypertension  Hypothyroidism: Continue Synthyroid  Hyponatremia: Mild, continue to monitor.  Normocytic anemia: Currently hemoglobin stable in the range of 10-11.  Monitor  CKD stage IIIa: Currently kidney function at baseline.  Goals of care: CODE STATUS is DNR/DNI.         DVT prophylaxis:enoxaparin (LOVENOX) injection 40 mg Start: 05/04/22 2000     Code Status: DNR  Family Communication: Daughter at bedside  Patient status: Inpatient  Patient is from : Home  Anticipated discharge to: SNF  Estimated DC date: 2 to 3 days   Consultants: Orthopedics  Procedures: Plan for ORIF  Antimicrobials:  Anti-infectives (From admission, onward)    None        Subjective:  Patient seen and examined at the bedside this morning.  Hemodynamically stable during my evaluation, mild hypertensive.  Appears comfortable.  Pain well controlled.  Daughter at bedside.  Denies any new complaints.   Objective: Vitals:   05/04/22 0259 05/04/22 0626 05/04/22 0749 05/04/22 0927  BP: (!) 135/44 (!) 150/55 (!) 154/54   Pulse: 92 89 91 95  Resp: 18 18 18 16   Temp: 98.8 F (37.1 C) 99.4 F (37.4 C) 99.8 F (37.7 C)   TempSrc: Oral Oral Oral   SpO2: 99% 100% 98% 98%  Weight:      Height:        Intake/Output Summary (Last 24 hours) at 05/04/2022 1027 Last data filed at 05/04/2022 05/06/2022 Gross per 24 hour  Intake 532.28 ml  Output --  Net 532.28 ml   Filed Weights   05/03/22 1342  Weight: 58.1 kg    Examination:  General exam: Overall comfortable, not in distress, pleasant elderly female HEENT: PERRL Respiratory system:  no wheezes or crackles  Cardiovascular system: S1 & S2 heard, RRR.  Gastrointestinal system: Abdomen is nondistended, soft and nontender. Central nervous system: Alert and oriented Extremities: No edema, no clubbing ,no cyanosis, right lower extremity externally rotated, decreased range of motion with tenderness on the right hip Skin: No rashes, no ulcers,no icterus     Data Reviewed: I have personally reviewed following labs and imaging studies  CBC: Recent Labs  Lab 05/03/22 1524 05/04/22 0556  WBC 10.1 8.2  NEUTROABS 8.2*  --   HGB 11.2* 10.9*  HCT 33.4* 31.1*  MCV 91.8 88.6  PLT 150 158   Basic Metabolic Panel: Recent Labs  Lab 05/03/22 1524 05/04/22 0556  NA 134* 131*  K 3.9 4.2  CL 101 101  CO2 25 22  GLUCOSE 160* 129*  BUN 24* 19  CREATININE 1.15* 1.08*  CALCIUM 8.9 8.8*     Recent Results (from the past 240 hour(s))  Surgical PCR screen     Status: None   Collection Time: 05/04/22  3:03 AM   Specimen: Nasal Mucosa; Nasal Swab  Result Value Ref Range Status   MRSA, PCR NEGATIVE  NEGATIVE Final   Staphylococcus aureus NEGATIVE NEGATIVE Final    Comment: (NOTE) The Xpert SA Assay (FDA approved for NASAL specimens in patients 10 years of age and older), is one component of a comprehensive surveillance program. It is not intended to diagnose infection nor to guide or monitor treatment. Performed at [facility], [address], [place], [place] [address]      Radiology Studies: DG FEMUR PORT, MIN 2 VIEWS RIGHT  Result Date: 05/04/2022 CLINICAL DATA:  Right hip fracture, preoperative evaluation EXAM: RIGHT FEMUR PORTABLE 2 VIEW COMPARISON:  None Available. FINDINGS: Frontal and frogleg lateral views of the right femur are obtained on 5 images. The comminuted intertrochanteric right hip fracture seen previously is again identified, with slight varus angulation at the fracture site. No dislocation. The remainder of the visualized right femur is unremarkable. Mild right knee osteoarthritis. Prominent soft tissue swelling overlying the right hip fracture. IMPRESSION: 1. Intertrochanteric right hip fracture, with slight varus angulation at the fracture site. 2. Remaining portions of the right femur are unremarkable. Electronically Signed   By: Sharlet Salina M.D.   On: 05/04/2022 09:57   CT Hip Right Wo Contrast  Result Date: 05/03/2022 CLINICAL DATA:  Right hip pain after fall EXAM: CT OF THE RIGHT HIP WITHOUT CONTRAST TECHNIQUE: Multidetector CT imaging of the right hip was performed according to the standard protocol. Multiplanar CT image reconstructions were also generated. RADIATION DOSE REDUCTION: This exam was performed according to the departmental dose-optimization program which includes automated exposure control, adjustment of the mA and/or kV according to patient size and/or use of iterative reconstruction technique. COMPARISON:  Radiographs earlier today FINDINGS: Bones/Joint/Cartilage Redemonstrated mildly-comminuted intertrochanteric right femur fracture. There  is apex anterior angulation. Normal alignment of the femoral head within the acetabulum. Ligaments Suboptimally assessed by CT. Muscles and Tendons Unremarkable. Soft tissues Small amount of edema about the intertrochanteric fracture. IMPRESSION: Intertrochanteric right femur fracture. Electronically Signed   By: Minerva Fester M.D.   On: 05/03/2022 17:24   DG Hip Unilat W or Wo Pelvis 2-3 Views Right  Result Date: 05/03/2022 CLINICAL DATA:  Trauma, fall EXAM: DG HIP (WITH OR WITHOUT PELVIS) 2-3V RIGHT COMPARISON:  None Available. FINDINGS: Intertrochanteric fracture is seen in the neck of right femur. There is offset in alignment of medial cortical margin. There is no dislocation. IMPRESSION: Intertrochanteric fracture of proximal right femur. Electronically Signed   By: Ernie Avena M.D.   On: 05/03/2022 15:05    Scheduled Meds:  acidophilus  1 capsule Oral Daily   cholecalciferol  1,000 Units Oral Daily   enoxaparin (LOVENOX) injection  40 mg Subcutaneous Q24H   levothyroxine  50 mcg Oral Q0600   losartan  100 mg Oral Daily   mupirocin ointment  1 Application Nasal BID   senna  1 tablet Oral BID   Continuous Infusions:  sodium chloride 75 mL/hr at 05/04/22 0638   methocarbamol (ROBAXIN) IV       LOS: 0 days  Burnadette Pop, MD [facility] P8/18/2023, 10:27 AM

## 2022-05-04 NOTE — Consult Note (Signed)
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  She states she tripped over the boxes landing to her right side.  She was unable to weight-bear after the fall.  She localizes pain to the right hip area.  She has some abrasions to her right elbow but he was able to move her upper extremity without any discomfort.  She denies distal numbness and tingling.  She denies pain in other joints or extremities.  Past Medical History:  Diagnosis Date   Hypertension    Hypothyroidism    Thyroid disease    Past Surgical History:  Procedure Laterality Date   BREAST SURGERY Right    cyst   CATARACT EXTRACTION W/PHACO Left 02/29/2020   Procedure: CATARACT EXTRACTION PHACO AND INTRAOCULAR LENS PLACEMENT (IOC);  Surgeon: [name], MD;  Location: [facility];  Service: Ophthalmology;  Laterality: Left;  CDE: 10.16   CATARACT EXTRACTION W/PHACO Right 03/14/2020   Procedure: CATARACT EXTRACTION PHACO AND INTRAOCULAR LENS PLACEMENT RIGHT EYE;  Surgeon: [name], MD;  Location: [facility];  Service: Ophthalmology;  Laterality: Right;  CDE: 8.59   Social History   Socioeconomic History   Marital status: Widowed    Spouse name: Not on file   Number of children: Not on file   Years of education: Not on file   Highest education level: Not on file  Occupational History   Not on file  Tobacco Use   Smoking status: Never   Smokeless tobacco: Never  Substance and Sexual Activity   Alcohol use: No   Drug use: No   Sexual activity: Not on file  Other Topics Concern   Not on file  Social History Narrative   Not on file   Social Determinants of Health   Financial Resource Strain: Not on file  Food Insecurity: Not on file  Transportation Needs: Not on file  Physical Activity: Not on file  Stress: Not  on file  Social Connections: Not on file   Family History  Problem Relation Age of Onset   Stroke Father    No Known Allergies   Positive ROS: All other systems have been reviewed and were otherwise negative with the exception of those mentioned in the HPI and as above.  Physical Exam: General: Alert, no acute distress Cardiovascular: No pedal edema Respiratory: No cyanosis, no use of accessory musculature Skin: No lesions in the area of chief complaint Neurologic: Sensation intact distally Psychiatric: Patient is competent for consent with normal mood and affect  MUSCULOSKELETAL:  LLE No traumatic wounds, ecchymosis, or rash  Nontender  No groin pain with log roll  No knee or ankle effusion  Sens DPN, SPN, TN intact  Motor EHL, ext, flex 5/5  DP 2+, PT 2+, No significant edema  RLE No traumatic wounds, ecchymosis, or rash  Nontender  No knee or ankle effusion  Sens DPN, SPN, TN intact  Motor EHL, ext, flex 5/5  DP 2+, PT 2+, No significant edema   IMAGING: X-rays and CT scan reviewed demonstrate a comminuted basicervical and transcervical femoral neck fracture  Assessment: Principal Problem:   Closed right hip fracture (HCC) Active Problems:   Thyroid disease   Hypertension   Chronic kidney disease, stage 3a (HCC)  Right displaced basicervical femoral neck fracture with transcervical extension  Plan: Discussed  with patient and family today findings of her x-ray and CT scan demonstrating comminuted femoral neck fracture with extension transcervical to basicervical.  This images were reviewed with Dr. Everardo Pacific who was on-call.  Had concern for nonunion or malunion with open reduction internal fixation with a cephalomedullary nail device.  Ultimately feel more predictable return to function with arthroplasty.  Given the patient's high baseline function think she would do well with a total hip arthroplasty.  We will plan for cemented stem given the extension to the  basicervical neck.  Possible cerclage cables if needed.  The risks benefits and alternatives were discussed with the patient including but not limited to the risks of nonoperative treatment, versus surgical intervention including infection, bleeding, nerve injury, periprosthetic fracture, the need for revision surgery, dislocation, leg length discrepancy, blood clots, cardiopulmonary complications, morbidity, mortality, among others, and they were willing to proceed.     Joen Laura, MD  Contact information:   [id number] 7am-5pm epic message Dr. Blanchie Dessert, or call office for patient follow up: [phone] After hours and holidays please check [email or web address] for group call information for [facility]

## 2022-05-04 NOTE — Progress Notes (Signed)
Patient displaced basicervical/transcevical femoral neck fracture. Imaging reviewed with Dr. Everardo Pacific, concern for poor fixation not amenable to ORIF with nail. Will plan for cemented hip hemiarthroplasty vs total hip arthroplasty possible this afternoon pending OR availability. Full consult note to follow later this morning.

## 2022-05-05 ENCOUNTER — Inpatient Hospital Stay (HOSPITAL_COMMUNITY): Payer: Medicare HMO

## 2022-05-05 ENCOUNTER — Encounter (HOSPITAL_COMMUNITY)
Admission: EM | Disposition: A | Discharge status: Skilled Nursing Facility | Payer: Self-pay | Source: Home / Self Care | Attending: Family Medicine

## 2022-05-05 ENCOUNTER — Encounter (HOSPITAL_COMMUNITY): Payer: Self-pay | Admitting: Internal Medicine

## 2022-05-05 ENCOUNTER — Inpatient Hospital Stay (HOSPITAL_COMMUNITY): Payer: Medicare HMO | Admitting: Anesthesiology

## 2022-05-05 ENCOUNTER — Other Ambulatory Visit: Payer: Self-pay

## 2022-05-05 DIAGNOSIS — E039 Hypothyroidism, unspecified: Secondary | ICD-10-CM | POA: Diagnosis not present

## 2022-05-05 DIAGNOSIS — D638 Anemia in other chronic diseases classified elsewhere: Secondary | ICD-10-CM | POA: Diagnosis not present

## 2022-05-05 DIAGNOSIS — S72001A Fracture of unspecified part of neck of right femur, initial encounter for closed fracture: Secondary | ICD-10-CM | POA: Diagnosis not present

## 2022-05-05 DIAGNOSIS — N1831 Chronic kidney disease, stage 3a: Secondary | ICD-10-CM

## 2022-05-05 DIAGNOSIS — I10 Essential (primary) hypertension: Secondary | ICD-10-CM

## 2022-05-05 DIAGNOSIS — E079 Disorder of thyroid, unspecified: Secondary | ICD-10-CM | POA: Diagnosis not present

## 2022-05-05 HISTORY — PX: TOTAL HIP ARTHROPLASTY: SHX124

## 2022-05-05 LAB — BASIC METABOLIC PANEL
Anion gap: 8 (ref 5–15)
BUN: 15 mg/dL (ref 8–23)
CO2: 21 mmol/L — ABNORMAL LOW (ref 22–32)
Calcium: 8.7 mg/dL — ABNORMAL LOW (ref 8.9–10.3)
Chloride: 103 mmol/L (ref 98–111)
Creatinine, Ser: 1.04 mg/dL — ABNORMAL HIGH (ref 0.44–1.00)
GFR, Estimated: 51 mL/min — ABNORMAL LOW (ref >60)
Glucose, Bld: 122 mg/dL — ABNORMAL HIGH (ref 70–99)
Potassium: 3.9 mmol/L (ref 3.5–5.1)
Sodium: 132 mmol/L — ABNORMAL LOW (ref 135–145)

## 2022-05-05 LAB — CBC
HCT: 30.8 % — ABNORMAL LOW (ref 36.0–46.0)
Hemoglobin: 10.7 g/dL — ABNORMAL LOW (ref 12.0–15.0)
MCH: 31.2 pg (ref 26.0–34.0)
MCHC: 34.7 g/dL (ref 30.0–36.0)
MCV: 89.8 fL (ref 80.0–100.0)
Platelets: 144 10*3/uL — ABNORMAL LOW (ref 150–400)
RBC: 3.43 MIL/uL — ABNORMAL LOW (ref 3.87–5.11)
RDW: 12.3 % (ref 11.5–15.5)
WBC: 7.3 10*3/uL (ref 4.0–10.5)
nRBC: 0 % (ref 0.0–0.2)

## 2022-05-05 SURGERY — ARTHROPLASTY, HIP, TOTAL,POSTERIOR APPROACH
Anesthesia: Monitor Anesthesia Care | Site: Hip | Laterality: Right

## 2022-05-05 MED ORDER — BUPIVACAINE LIPOSOME 1.3 % IJ SUSP
INTRAMUSCULAR | Status: AC
  Filled 2022-05-05: qty 20

## 2022-05-05 MED ORDER — CHLORHEXIDINE GLUCONATE 4 % EX LIQD: 60.0000 mL | Freq: Once | CUTANEOUS | Status: DC

## 2022-05-05 MED ORDER — CHLORHEXIDINE GLUCONATE 0.12 % MT SOLN
OROMUCOSAL | Status: AC
  Administered 2022-05-05: 15 mL via OROMUCOSAL
  Filled 2022-05-05: qty 15

## 2022-05-05 MED ORDER — FENTANYL CITRATE (PF) 250 MCG/5ML IJ SOLN
INTRAMUSCULAR | Status: DC | PRN
Start: 2022-05-05 — End: 2022-05-05
  Administered 2022-05-05 (×3): 25 ug via INTRAVENOUS

## 2022-05-05 MED ORDER — BUPIVACAINE IN DEXTROSE 0.75-8.25 % IT SOLN
INTRATHECAL | Status: DC | PRN
  Administered 2022-05-05: 1.6 mL via INTRATHECAL

## 2022-05-05 MED ORDER — PROPOFOL 10 MG/ML IV BOLUS
INTRAVENOUS | Status: AC
  Filled 2022-05-05: qty 20

## 2022-05-05 MED ORDER — CHLORHEXIDINE GLUCONATE 0.12 % MT SOLN: 15.0000 mL | Freq: Once | OROMUCOSAL | Status: AC

## 2022-05-05 MED ORDER — ASPIRIN 81 MG PO TBEC
81.0000 mg | DELAYED_RELEASE_TABLET | Freq: Two times a day (BID) | ORAL | Status: DC
  Administered 2022-05-06 – 2022-05-09 (×7): 81 mg via ORAL
  Filled 2022-05-05 (×7): qty 1

## 2022-05-05 MED ORDER — EPHEDRINE 5 MG/ML INJ
INTRAVENOUS | Status: AC
  Filled 2022-05-05: qty 5

## 2022-05-05 MED ORDER — PROPOFOL 500 MG/50ML IV EMUL
INTRAVENOUS | Status: DC | PRN
  Administered 2022-05-05: 30 ug/kg/min via INTRAVENOUS

## 2022-05-05 MED ORDER — SODIUM CHLORIDE 0.9 % IR SOLN
Status: DC | PRN
  Administered 2022-05-05: 3000 mL
  Administered 2022-05-05: 1000 mL

## 2022-05-05 MED ORDER — PROPOFOL 10 MG/ML IV BOLUS
INTRAVENOUS | Status: DC | PRN
  Administered 2022-05-05: 30 mg via INTRAVENOUS
  Administered 2022-05-05: 20 mg via INTRAVENOUS
  Administered 2022-05-05: 40 mg via INTRAVENOUS
  Administered 2022-05-05: 20 mg via INTRAVENOUS

## 2022-05-05 MED ORDER — FENTANYL CITRATE (PF) 100 MCG/2ML IJ SOLN
25.0000 ug | INTRAMUSCULAR | Status: DC | PRN
  Administered 2022-05-05: 25 ug via INTRAVENOUS

## 2022-05-05 MED ORDER — FENTANYL CITRATE (PF) 100 MCG/2ML IJ SOLN
INTRAMUSCULAR | Status: AC
  Filled 2022-05-05: qty 2

## 2022-05-05 MED ORDER — AMISULPRIDE (ANTIEMETIC) 5 MG/2ML IV SOLN
10.0000 mg | Freq: Once | INTRAVENOUS | Status: AC
Start: 2022-05-05 — End: 2022-05-05
  Administered 2022-05-05: 10 mg via INTRAVENOUS

## 2022-05-05 MED ORDER — FENTANYL CITRATE (PF) 250 MCG/5ML IJ SOLN
INTRAMUSCULAR | Status: AC
  Filled 2022-05-05: qty 5

## 2022-05-05 MED ORDER — 0.9 % SODIUM CHLORIDE (POUR BTL) OPTIME
TOPICAL | Status: DC | PRN
  Administered 2022-05-05: 1000 mL

## 2022-05-05 MED ORDER — CEFAZOLIN SODIUM-DEXTROSE 2-4 GM/100ML-% IV SOLN
2.0000 g | Freq: Three times a day (TID) | INTRAVENOUS | Status: AC
  Administered 2022-05-05: 2 g via INTRAVENOUS
  Filled 2022-05-05 (×2): qty 100

## 2022-05-05 MED ORDER — IRRISEPT - 450ML BOTTLE WITH 0.05% CHG IN STERILE WATER, USP 99.95% OPTIME
TOPICAL | Status: DC | PRN
  Administered 2022-05-05: 450 mL

## 2022-05-05 MED ORDER — LACTATED RINGERS IV SOLN: INTRAVENOUS | Status: DC

## 2022-05-05 MED ORDER — TRANEXAMIC ACID-NACL 1000-0.7 MG/100ML-% IV SOLN
INTRAVENOUS | Status: AC
  Filled 2022-05-05: qty 100

## 2022-05-05 MED ORDER — EPHEDRINE SULFATE-NACL 50-0.9 MG/10ML-% IV SOSY
PREFILLED_SYRINGE | INTRAVENOUS | Status: DC | PRN
  Administered 2022-05-05: 2.5 mg via INTRAVENOUS

## 2022-05-05 MED ORDER — POVIDONE-IODINE 10 % EX SWAB: 2.0000 | Freq: Once | CUTANEOUS | Status: DC

## 2022-05-05 MED ORDER — ORAL CARE MOUTH RINSE: 15.0000 mL | Freq: Once | OROMUCOSAL | Status: AC

## 2022-05-05 MED ORDER — TRANEXAMIC ACID-NACL 1000-0.7 MG/100ML-% IV SOLN
1000.0000 mg | INTRAVENOUS | Status: AC
  Administered 2022-05-05: 1000 mg via INTRAVENOUS

## 2022-05-05 MED ORDER — ACETAMINOPHEN 500 MG PO TABS: 1000.0000 mg | ORAL_TABLET | Freq: Once | ORAL | Status: DC | PRN

## 2022-05-05 MED ORDER — CEFAZOLIN SODIUM-DEXTROSE 2-4 GM/100ML-% IV SOLN
2.0000 g | INTRAVENOUS | Status: AC
  Administered 2022-05-05: 2 g via INTRAVENOUS

## 2022-05-05 MED ORDER — SODIUM CHLORIDE (PF) 0.9 % IJ SOLN
INTRAMUSCULAR | Status: DC | PRN
  Administered 2022-05-05: 60 mL

## 2022-05-05 MED ORDER — LIDOCAINE 2% (20 MG/ML) 5 ML SYRINGE
INTRAMUSCULAR | Status: AC
Start: 2022-05-05 — End: ?
  Filled 2022-05-05: qty 5

## 2022-05-05 MED ORDER — ACETAMINOPHEN 160 MG/5ML PO SOLN: 1000.0000 mg | Freq: Once | ORAL | Status: DC | PRN

## 2022-05-05 MED ORDER — LIDOCAINE 2% (20 MG/ML) 5 ML SYRINGE
INTRAMUSCULAR | Status: DC | PRN
  Administered 2022-05-05: 40 mg via INTRAVENOUS

## 2022-05-05 MED ORDER — ACETAMINOPHEN 10 MG/ML IV SOLN: 1000.0000 mg | Freq: Once | INTRAVENOUS | Status: DC | PRN

## 2022-05-05 MED ORDER — BUPIVACAINE LIPOSOME 1.3 % IJ SUSP
INTRAMUSCULAR | Status: DC | PRN
  Administered 2022-05-05: 20 mL

## 2022-05-05 MED ORDER — OXYCODONE HCL 5 MG PO TABS
2.5000 mg | ORAL_TABLET | ORAL | Status: DC | PRN
  Administered 2022-05-05 – 2022-05-06 (×3): 5 mg via ORAL
  Filled 2022-05-05 (×3): qty 1

## 2022-05-05 MED ORDER — PHENYLEPHRINE HCL-NACL 20-0.9 MG/250ML-% IV SOLN
INTRAVENOUS | Status: DC | PRN
  Administered 2022-05-05: 50 ug/min via INTRAVENOUS

## 2022-05-05 MED ORDER — AMISULPRIDE (ANTIEMETIC) 5 MG/2ML IV SOLN
INTRAVENOUS | Status: AC
  Filled 2022-05-05: qty 4

## 2022-05-05 MED ORDER — CEFAZOLIN SODIUM-DEXTROSE 2-4 GM/100ML-% IV SOLN
INTRAVENOUS | Status: AC
  Administered 2022-05-05: 2 g via INTRAVENOUS
  Filled 2022-05-05: qty 100

## 2022-05-05 SURGICAL SUPPLY — 64 items
ADH SKN CLS APL DERMABOND .7 (GAUZE/BANDAGES/DRESSINGS) ×1
APL PRP STRL LF DISP 70% ISPRP (MISCELLANEOUS) ×2
BLADE SAGITTAL 25.0X1.27X90 (BLADE) ×1 IMPLANT
BRUSH FEMORAL CANAL (MISCELLANEOUS) IMPLANT
CEMENT BONE SIMPLEX SPEEDSET (Cement) IMPLANT
CHLORAPREP W/TINT 26 (MISCELLANEOUS) ×2 IMPLANT
COVER SURGICAL LIGHT HANDLE (MISCELLANEOUS) ×1 IMPLANT
DERMABOND ADVANCED (GAUZE/BANDAGES/DRESSINGS) ×1
DERMABOND ADVANCED .7 DNX12 (GAUZE/BANDAGES/DRESSINGS) IMPLANT
DRAPE HALF SHEET 40X57 (DRAPES) ×1 IMPLANT
DRAPE HIP W/POCKET STRL (MISCELLANEOUS) ×1 IMPLANT
DRAPE INCISE IOBAN 66X45 STRL (DRAPES) ×1 IMPLANT
DRAPE INCISE IOBAN 85X60 (DRAPES) ×2 IMPLANT
DRAPE POUCH INSTRU U-SHP 10X18 (DRAPES) ×1 IMPLANT
DRAPE U-SHAPE 47X51 STRL (DRAPES) ×2 IMPLANT
DRSG AQUACEL AG ADV 3.5X10 (GAUZE/BANDAGES/DRESSINGS) ×1 IMPLANT
ELECT BLADE 4.0 EZ CLEAN MEGAD (MISCELLANEOUS) ×1
ELECTRODE BLDE 4.0 EZ CLN MEGD (MISCELLANEOUS) ×1 IMPLANT
GLOVE BIOGEL PI IND STRL 8 (GLOVE) IMPLANT
GLOVE BIOGEL PI INDICATOR 8 (GLOVE) ×1
GLOVE SURG ORTHO 8.0 STRL STRW (GLOVE) ×2 IMPLANT
GOWN STRL REUS W/ TWL LRG LVL3 (GOWN DISPOSABLE) ×2 IMPLANT
GOWN STRL REUS W/ TWL XL LVL3 (GOWN DISPOSABLE) ×1 IMPLANT
GOWN STRL REUS W/TWL LRG LVL3 (GOWN DISPOSABLE) ×1
GOWN STRL REUS W/TWL XL LVL3 (GOWN DISPOSABLE) ×1
HANDPIECE INTERPULSE COAX TIP (DISPOSABLE) ×1
HEAD BIOLOX HIP 36/-2.5 (Joint) IMPLANT
HIP BIOLOX HD 36/-2.5 (Joint) ×1 IMPLANT
HOOD PEEL AWAY FLYTE STAYCOOL (MISCELLANEOUS) ×3 IMPLANT
INSERT 0 DEGREE 36 (Miscellaneous) IMPLANT
JET LAVAGE IRRISEPT WOUND (IRRIGATION / IRRIGATOR) ×1
KIT BASIN OR (CUSTOM PROCEDURE TRAY) ×1 IMPLANT
KIT TURNOVER KIT A (KITS) ×1 IMPLANT
LAVAGE JET IRRISEPT WOUND (IRRIGATION / IRRIGATOR) IMPLANT
MANIFOLD NEPTUNE II (INSTRUMENTS) ×1 IMPLANT
MARKER SKIN DUAL TIP RULER LAB (MISCELLANEOUS) ×1 IMPLANT
NDL SPNL 18GX3.5 QUINCKE PK (NEEDLE) IMPLANT
NEEDLE SPNL 18GX3.5 QUINCKE PK (NEEDLE) ×1 IMPLANT
NS IRRIG 1000ML POUR BTL (IV SOLUTION) ×1 IMPLANT
PACK TOTAL JOINT (CUSTOM PROCEDURE TRAY) ×1 IMPLANT
PRESSURIZER FEMORAL UNIV (MISCELLANEOUS) IMPLANT
RETRIEVER SUT HEWSON (MISCELLANEOUS) ×1 IMPLANT
SCREW HEX LP 6.5X25 (Screw) IMPLANT
SCREW HEX LP 6.5X30 (Screw) IMPLANT
SEALER BIPOLAR AQUA 6.0 (INSTRUMENTS) ×1 IMPLANT
SET HNDPC FAN SPRY TIP SCT (DISPOSABLE) ×1 IMPLANT
SHELL TRIDENT II CLUST 50 (Shell) IMPLANT
SPONGE T-LAP 18X18 ~~LOC~~+RFID (SPONGE) ×2 IMPLANT
STEM HIP ACCOLADE SZ5 37X145 (Stem) IMPLANT
SUCTION FRAZIER HANDLE 10FR (MISCELLANEOUS) ×1
SUCTION TUBE FRAZIER 10FR DISP (MISCELLANEOUS) ×1 IMPLANT
SUT ETHIBOND 2 V 37 (SUTURE) ×1 IMPLANT
SUT MNCRL AB 3-0 PS2 18 (SUTURE) ×1 IMPLANT
SUT STRATAFIX 1PDS 45CM VIOLET (SUTURE) ×2 IMPLANT
SUT VIC AB 0 CT1 27 (SUTURE) ×1
SUT VIC AB 0 CT1 27XBRD ANBCTR (SUTURE) ×1 IMPLANT
SUT VIC AB 2-0 CT2 27 (SUTURE) ×2 IMPLANT
SYR 20ML LL LF (SYRINGE) ×1 IMPLANT
SYR 50ML LL SCALE MARK (SYRINGE) ×1 IMPLANT
TOWEL GREEN STERILE (TOWEL DISPOSABLE) ×1 IMPLANT
TOWER CARTRIDGE SMART MIX (DISPOSABLE) IMPLANT
TRAY FOLEY MTR SLVR 16FR STAT (SET/KITS/TRAYS/PACK) ×1 IMPLANT
TUBE SUCT ARGYLE STRL (TUBING) ×1 IMPLANT
WATER STERILE IRR 1000ML POUR (IV SOLUTION) ×1 IMPLANT

## 2022-05-05 NOTE — Op Note (Signed)
05/05/2022  10:25 AM  PATIENT:  Olivia Ramsey   MRN: [id number]  PRE-OPERATIVE DIAGNOSIS:  Right displaced femoral neck fracture  POST-OPERATIVE DIAGNOSIS:  same  PROCEDURE:  Procedure(s): Right total hip arthroplasty, cemented stem, posterior approach  PREOPERATIVE INDICATIONS:    [name] is an 88 y.o. female who has a diagnosis of right displaced femoral neck fracture and elected for surgical management after failing conservative treatment.  The risks benefits and alternatives were discussed with the patient including but not limited to the risks of nonoperative treatment, versus surgical intervention including infection, bleeding, nerve injury, periprosthetic fracture, the need for revision surgery, dislocation, leg length discrepancy, blood clots, cardiopulmonary complications, morbidity, mortality, among others, and they were willing to proceed.     OPERATIVE REPORT     SURGEON:  Charlies Constable, MD    ASSISTANT: Ky Barban, RNFA, (Present throughout the entire procedure,  necessary for completion of procedure in a timely manner, assisting with retraction, instrumentation, and closure)     ANESTHESIA: Spinal  ESTIMATED BLOOD LOSS: 973 cc    COMPLICATIONS:  None.    COMPONENTS:   Stryker Trident 2 50 mm acetabular shell, neutral liner, 6.5 mm hex screws x2 Accolade C1 127 degree neck angle size number 5 36 mm -2.5 mm ceramic head Implant Name Type Inv. Item Serial No. Manufacturer Lot No. LRB No. Used Action  STEM HIP ACCOLADE SZ5 [id number] - [id number] Stem STEM HIP ACCOLADE SZ5 [id number]  [employer] [id number] Right 1 Implanted  INSERT 0 DEGREE 36 - [id number] Miscellaneous INSERT 0 DEGREE 36  [employer] [id number] Right 1 Implanted  SCREW HEX LP 6.5X25 - [id number] Screw SCREW HEX LP 6.5X25  [employer] [id number] Right 1 Implanted  SCREW HEX LP 6.5X30 - [id number] Screw SCREW HEX LP 6.5X30  [employer] [id number] Right 1 Implanted  SHELL TRIDENT II CLUST  50 - [id number] Shell SHELL TRIDENT II CLUST 50  [employer] [id number] Right 1 Implanted  CEMENT BONE SIMPLEX SPEEDSET - [id number] Cement CEMENT BONE SIMPLEX SPEEDSET  [employer] [id number] Right 2 Implanted  HIP BIOLOX HD 36/-2.5 - [id number] Joint HIP BIOLOX HD 36/-2.5  [employer] [id number] Right 1 Implanted      PROCEDURE IN DETAIL:   The patient was met in the holding area and  identified.  The appropriate hip was identified and marked at the operative site.  The patient was then transported to the OR  and  placed under anesthesia.  At that point, the patient was  placed in the lateral decubitus position with the operative side up and  secured to the operating room table  and all bony prominences padded. A subaxillary role was also placed.    The operative lower extremity was prepped from the iliac crest to the distal leg.  Sterile draping was performed.  Time out was performed prior to incision.      A routine posterolateral approach was utilized via sharp dissection  carried down to the subcutaneous tissue.  Gross bleeders were Bovie coagulated.  The iliotibial band was identified and incised along the length of the skin incision through the glute max fascia.  Charnley retractor was placed with care to protect the sciatic nerve posteriorly.  With the hip internally rotated, the piriformis tendon was identified and released from the femoral insertion and tagged with a #2 Ethibond.  A capsulotomy was then performed off the femoral insertion and also tagged with a #2 Ethibond.    The femoral neck was exposed,  the resection was performed at the distalmost point of the fracture.  Following resection the remaining neck measured approximately 7 mm above the lesser trochanter.  The femoral head was then easily removed with a corkscrew and measured to be about 45 mm    I then exposed the deep acetabulum, cleared out any tissue including the ligamentum teres.  After adequate  visualization, I excised the labrum.  I then started reaming with a 46 mm reamer, first medializing to the floor of the cotyloid fossa, and then in the position of the cup aiming towards the greater sciatic notch, matching the version of the transverse acetabular ligament and tucked under the anterior wall. I reamed up to 50 mm reamer with good bony bed preparation and a 50 mm cup was chosen.  The real cup was then impacted into place.  Appropriate version and inclination was confirmed clinically matching their bony anatomy, and also with the use of the jig.  I placed 2 screws in the posterior superior quadrant to augment fixation.  A neutral liner was placed and impacted. It was confirmed to be appropriately seated and the acetabular retractors were removed.    I then prepared the proximal femur using the box cutter, Charnley awl, and then sequentially broached starting with 0 up to a size 5.  A trial broach, neck, and head was utilized, and I reduced the hip and it was found to have excellent stability.  There was no impingement with full extension and 90 degrees external rotation.  The hip was stable at the position of sleep and with 90 degrees flexion and 90 degrees of internal rotation.  Leg lengths were also clinically assessed in the lateral position and felt to be equal.   We then prepared canal for cementation.  The cement restrictor was measured and inserted distally.  The canal was then irrigated with the pulse lavage and 3 L of normal saline.  2 bags of Simplex cement were prepared.  Using the cement gun the cement was inserted distally and the canal was filled.  We then pressurized the canal. The real implant was then inserted matching the patient's native anteversion of approximately 15 degrees.  We then waited for 15 minutes for the cement to be fully set.  Excess cement was removed.  A lap was placed in the acetabulum prior to cementing was also removed and the acetabulum was assessed to make  sure there was no cement or bone fragments.  I again trialed and selected a 36 -2.5 mm ball. The hip was then reduced and taken through a range of motion. There was no impingement with full extension and 90 degrees external rotation.  The hip was stable at the position of sleep and with 90 degrees flexion and 80 degrees of internal rotation. Leg lengths were  again assessed and felt to be restored.  We then opened, and I impacted the real head ball into place.  The posterior capsule was then closed with #2 Ethibond.  The piriformis was repaired through the base of the abductor tendon using a [place] suture passer.  I then irrigated the hip copiously with Irrisept CHG irrigaiton and with normal saline pulse lavage. Periarticular injection was then performed with Exparel.   We repaired the fascia #1 barbed suture, followed by vicryl suture for the subcutaneous fat.  Skin was closed with 2-0 Vicryl and 3-0 Monocryl.  Dermabond and Aquacel dressing were applied. The patient was then awakened and returned to PACU in stable and  satisfactory condition.  Leg lengths in the supine position were assessed and felt to be clinically equal. There were no complications.  Post op recs: WB: WBAT RLE, No formal hip precautions Abx: ancef Imaging: PACU pelvis Xray Dressing: Aquacell, keep intact until follow up DVT prophylaxis: Aspirin 81BID starting POD1 Follow up: 2 weeks after surgery for a wound check with Dr. Zachery Dakins at [facility].  Address: [address], [place], [place] [address]  Office Phone: [phone]   Charlies Constable, MD Orthopedic Surgeon

## 2022-05-05 NOTE — Progress Notes (Signed)
Progress Note  Patient: Olivia Ramsey GLO:[id number] DOB: [birth year]  DOA: 05/03/2022  DOS: 05/05/2022    Brief hospital course: Olivia Ramsey is an active 88 y.o. female with a history of HTN, hypothyroidism who presented to the ED 8/17 after a fall at home found to have right hip fracture, transferred to [facility] where THA was performed 8/19.  Assessment and Plan: Closed right hip, intertrochanteric, fracture sustained from mechanical fall: Now s/p right THA, cemented stem, posterior approach 8/19 by Dr. Blanchie Dessert. Postoperative XR shows good positioning per radiology report.  - WBAT RLE, No formal hip precautions - Keep Aquacell dressing intact until follow up - Aspirin 81mg  po BID starting POD1 for VTE prophylaxis.  - Follow up 2 weeks after surgery for a wound check with Dr. at [facility] - Pain control ordered per orthopedics.  - Check vitamin D, was on supplement PTA.  - Postoperative PT/OT consults ordered. Pt with very good PTA activity tolerance for age. History does not suggest syncope.  Normocytic anemia:  - Check postoperative CBC in AM  Thrombocytopenia: Isolated, mild.  - Recheck with CBC in AM, monitor for bleeding  Hyponatremia: Mild.  - Follow trend. Initiate work up if persistent.   HTN:  - Continue home losartan, prn hydralazine  Hypothyroidism:  - Continue synthroid stable dose.   Stage IIIa CKD: Will adjust diagnosis based on trends if needed. Cr 1.04.  - Avoid nephrotoxins - Monitor closely  DNR status: POA.   Subjective: Pain controlled immediately post op. Fentanyl made her nauseated, about to get antiemetic. Ready to do PT/OT. Pt and family consider home vs. SNF.   Objective: Vitals:   05/05/22 1030 05/05/22 1045 05/05/22 1100 05/05/22 1115  BP: (!) 127/54 (!) 156/89 (!) 154/45 134/75  Pulse: 72 74 68 73  Resp: 15 14 12 17   Temp:      TempSrc:      SpO2: 97% 99% 99% 99%  Weight:      Height:       Gen: Nontoxic elderly  well nourished female in no distress Pulm: Nonlabored breathing room air. Clear CV: Regular rate and rhythm. No murmur, rub, or gallop. No JVD, no dependent edema. GI: Abdomen soft, non-tender, non-distended, with normoactive bowel sounds.  Ext: Warm, no deformities. Distally NVI in bilateral LE's. Skin: Operative site dressing c/d/i, no rashes, lesions or ulcers on visualized skin. Neuro: Alert and oriented. No focal neurological deficits. Psych: Judgement and insight appear fair. Mood euthymic & affect congruent. Behavior is appropriate.    Data Personally reviewed: CBC: Recent Labs  Lab 05/03/22 1524 05/04/22 0556 05/05/22 0152  WBC 10.1 8.2 7.3  NEUTROABS 8.2*  --   --   HGB 11.2* 10.9* 10.7*  HCT 33.4* 31.1* 30.8*  MCV 91.8 88.6 89.8  PLT 150 158 144*   Basic Metabolic Panel: Recent Labs  Lab 05/03/22 1524 05/04/22 0556 05/05/22 0152  NA 134* 131* 132*  K 3.9 4.2 3.9  CL 101 101 103  CO2 25 22 21*  GLUCOSE 160* 129* 122*  BUN 24* 19 15  CREATININE 1.15* 1.08* 1.04*  CALCIUM 8.9 8.8* 8.7*   DG HIP UNILAT W OR W/O PELVIS 2-3 VIEWS RIGHT 05/05/2022 Well-positioned total right hip arthroplasty.    CT Hip Right Wo Contrast 05/03/2022 Intertrochanteric right femur fracture.    Family Communication: Daughters at bedside  Disposition: Remains inpatient appropriate because: Operative management of hip fracture, disposition per postoperative PT/OT recommendations    Krystalynn Ridgeway B  Jarvis Newcomer, MD 05/05/2022 12:47 PM Page by [email or web address]

## 2022-05-05 NOTE — Progress Notes (Signed)
Patient left for OT

## 2022-05-05 NOTE — Progress Notes (Signed)
Report given to OT nurse for surgery today.

## 2022-05-05 NOTE — Transfer of Care (Signed)
Immediate Anesthesia Transfer of Care Note  Patient: Olivia Ramsey  Procedure(s) Performed: CEMENTED TOTAL HIP ARTHROPLASTY (Right: Hip)  Patient Location: PACU  Anesthesia Type:MAC and Spinal  Level of Consciousness: awake and alert   Airway & Oxygen Therapy: Patient Spontanous Breathing  Post-op Assessment: Report given to RN and Post -op Vital signs reviewed and stable  Post vital signs: Reviewed and stable  Last Vitals:  Vitals Value Taken Time  BP 127/54 05/05/22 1030  Temp 36.2 C 05/05/22 1024  Pulse 67 05/05/22 1038  Resp 12 05/05/22 1038  SpO2 99 % 05/05/22 1038  Vitals shown include unvalidated device data.  Last Pain:  Vitals:   05/04/22 1932  TempSrc: Oral  PainSc: 4          Complications: No notable events documented.

## 2022-05-05 NOTE — Discharge Instructions (Signed)

## 2022-05-05 NOTE — Anesthesia Procedure Notes (Signed)
Procedure Name: MAC Date/Time: 05/05/2022 8:02 AM  Performed by: Lorie Phenix, CRNAPre-anesthesia Checklist: Patient identified, Emergency Drugs available, Suction available and Patient being monitored Patient Re-evaluated:Patient Re-evaluated prior to induction Oxygen Delivery Method: Nasal cannula Placement Confirmation: positive ETCO2

## 2022-05-05 NOTE — Anesthesia Procedure Notes (Signed)
Spinal  Patient location during procedure: OR Start time: 05/05/2022 8:08 AM End time: 05/05/2022 8:12 AM Reason for block: surgical anesthesia Staffing Performed: anesthesiologist  Anesthesiologist: Val Eagle, MD Performed by: Val Eagle, MD Authorized by: Val Eagle, MD   Preanesthetic Checklist Completed: patient identified, IV checked, risks and benefits discussed, surgical consent, monitors and equipment checked, pre-op evaluation and timeout performed Spinal Block Patient position: right lateral decubitus Prep: DuraPrep Patient monitoring: heart rate, cardiac monitor, continuous pulse ox and blood pressure Approach: midline Location: L4-5 Injection technique: single-shot Needle Needle type: Pencan  Needle gauge: 24 G Needle length: 9 cm Assessment Sensory level: T6 Events: CSF return

## 2022-05-05 NOTE — Anesthesia Preprocedure Evaluation (Addendum)
Anesthesia Evaluation  Patient identified by MRN, date of birth, ID band Patient awake    Reviewed: Allergy & Precautions, NPO status , Patient's Chart, lab work & pertinent test results  History of Anesthesia Complications Negative for: history of anesthetic complications  Airway Mallampati: III  TM Distance: <3 FB Neck ROM: Full    Dental  (+) Teeth Intact, Dental Advisory Given   Pulmonary neg pulmonary ROS,    breath sounds clear to auscultation       Cardiovascular hypertension,  Rhythm:Regular     Neuro/Psych negative neurological ROS     GI/Hepatic Neg liver ROS, GERD  Controlled,  Endo/Other  Hypothyroidism   Renal/GU Renal InsufficiencyRenal diseaseLab Results      Component                Value               Date                      CREATININE               1.04 (H)            05/05/2022                Musculoskeletal right basicervical fracture hip   Abdominal   Peds  Hematology  (+) Blood dyscrasia, anemia , Lab Results      Component                Value               Date                      WBC                      7.3                 05/05/2022                HGB                      10.7 (L)            05/05/2022                HCT                      30.8 (L)            05/05/2022                MCV                      89.8                05/05/2022                PLT                      144 (L)             05/05/2022           Denies blood thinners   Anesthesia Other Findings   Reproductive/Obstetrics                            Anesthesia Physical Anesthesia  Plan  ASA: 3  Anesthesia Plan: MAC and Spinal   Post-op Pain Management: Ofirmev IV (intra-op)*   Induction: Intravenous  PONV Risk Score and Plan: 2 and Propofol infusion and Treatment may vary due to age or medical condition  Airway Management Planned: Nasal Cannula  Additional Equipment:    Intra-op Plan:   Post-operative Plan:   Informed Consent: I have reviewed the patients History and Physical, chart, labs and discussed the procedure including the risks, benefits and alternatives for the proposed anesthesia with the patient or authorized representative who has indicated his/her understanding and acceptance.   Patient has DNR.   Dental advisory given  Plan Discussed with: CRNA  Anesthesia Plan Comments:        Anesthesia Quick Evaluation

## 2022-05-05 NOTE — Interval H&P Note (Signed)
The patient has been re-examined, and the chart reviewed, and there have been no interval changes to the documented history and physical.    Plan for R THA for femoral neck fracture  The operative side was examined and the patient was confirmed to have sensation to DPN, SPN, TN intact, Motor EHL, ext, flex 5/5, and DP 2+, PT 2+, No significant edema.   The risks, benefits, and alternatives have been discussed at length with patient, and the patient is willing to proceed.  Right hip marked. Consent has been signed.  

## 2022-05-06 DIAGNOSIS — N1831 Chronic kidney disease, stage 3a: Secondary | ICD-10-CM | POA: Diagnosis not present

## 2022-05-06 DIAGNOSIS — S72001A Fracture of unspecified part of neck of right femur, initial encounter for closed fracture: Secondary | ICD-10-CM | POA: Diagnosis not present

## 2022-05-06 DIAGNOSIS — E079 Disorder of thyroid, unspecified: Secondary | ICD-10-CM | POA: Diagnosis not present

## 2022-05-06 LAB — BASIC METABOLIC PANEL
Anion gap: 9 (ref 5–15)
BUN: 12 mg/dL (ref 8–23)
CO2: 20 mmol/L — ABNORMAL LOW (ref 22–32)
Calcium: 8.2 mg/dL — ABNORMAL LOW (ref 8.9–10.3)
Chloride: 101 mmol/L (ref 98–111)
Creatinine, Ser: 1.03 mg/dL — ABNORMAL HIGH (ref 0.44–1.00)
GFR, Estimated: 52 mL/min — ABNORMAL LOW (ref >60)
Glucose, Bld: 136 mg/dL — ABNORMAL HIGH (ref 70–99)
Potassium: 3.9 mmol/L (ref 3.5–5.1)
Sodium: 130 mmol/L — ABNORMAL LOW (ref 135–145)

## 2022-05-06 LAB — VITAMIN D 25 HYDROXY (VIT D DEFICIENCY, FRACTURES): Vit D, 25-Hydroxy: 43.38 ng/mL (ref 30–100)

## 2022-05-06 LAB — CBC
HCT: 27.3 % — ABNORMAL LOW (ref 36.0–46.0)
Hemoglobin: 9.7 g/dL — ABNORMAL LOW (ref 12.0–15.0)
MCH: 31.5 pg (ref 26.0–34.0)
MCHC: 35.5 g/dL (ref 30.0–36.0)
MCV: 88.6 fL (ref 80.0–100.0)
Platelets: 132 10*3/uL — ABNORMAL LOW (ref 150–400)
RBC: 3.08 MIL/uL — ABNORMAL LOW (ref 3.87–5.11)
RDW: 12.3 % (ref 11.5–15.5)
WBC: 9.3 10*3/uL (ref 4.0–10.5)
nRBC: 0 % (ref 0.0–0.2)

## 2022-05-06 NOTE — Progress Notes (Signed)
Physical Therapy Note     05/06/22 1012 05/06/22 1013  Vital Signs  Patient Position (if appropriate) Orthostatic Vitals  --   Orthostatic Lying   BP- Lying 148/50  --   Pulse- Lying 82  --   Orthostatic Sitting  BP- Sitting  (Did not obtain; asymptomatic) 122/41 (MAP 63; in recliner, feet up, trunk semi-reclined)  Pulse- Sitting  --  75  Orthostatic Standing at 0 minutes  BP- Standing at 0 minutes  (Did not obtain, symptomatic for dizziness, feeling like she would "pass out")  --    Eval done, full note to follow;  Overall the hip is moving and accepting weight well;   Ambulation limited by pre-syncopal symptoms, see above;  Noting SBP decr of 26 mmHg from supine at rest, then standing and a few steps to recliner, then sitting and reclined;   I posit her BP in standing was lower than 122/41;  Plan to get more formal set of Orthostatic BPs next session;   Anticipate will be able to get home with HHPT follow up, provided BPs with activity stabilize;   Van Clines,   Acute Rehabilitation Services Office [phone]

## 2022-05-06 NOTE — Progress Notes (Signed)
Patient noted to be tachycardia at 103 with low grade fever of 100.3. medicated with PRN medications . Charge nursd aware observation continues.

## 2022-05-06 NOTE — Progress Notes (Signed)
Progress Note  Patient: Olivia Ramsey YIF:[id number] DOB: 1930/10/14  DOA: 05/03/2022  DOS: 05/06/2022    Brief hospital course: Lasean C Hojnacki is an active 88 y.o. female with a history of HTN, hypothyroidism who presented to the ED 8/17 after a fall at home found to have right hip fracture, transferred to [facility] where THA was performed 8/19.  Assessment and Plan: Closed right hip, intertrochanteric, fracture sustained from mechanical fall: Now s/p right THA, cemented stem, posterior approach 8/19 by Dr. Blanchie Dessert. Postoperative XR shows good positioning per radiology report.  - WBAT RLE, No formal hip precautions - Keep Aquacell dressing intact until follow up - Aspirin 81mg  po BID starting POD1 for VTE prophylaxis.  - Follow up 2 weeks after surgery for a wound check with Dr. at [facility] - Pain control ordered per orthopedics. Adequate without sedation. - Check vitamin D, pending this AM, was on supplement PTA.  - Postoperative PT/OT consults ordered. Pt with very good PTA activity tolerance for age. History does not suggest syncope.  Normocytic anemia:  - Quite stable overall considering her operative, hgb 9.7g/dl this AM.   Thrombocytopenia: Isolated, mild.  - Recheck with CBC in AM, monitor for bleeding  Hyponatremia: Mild.  - Follow trend. Initiate work up if persistent at follow up.  HTN:  - Continue home losartan, prn hydralazine  Hypothyroidism:  - Continue synthroid stable dose.   Stage IIIa CKD: Will adjust diagnosis based on trends if needed. Cr 1.04.  - Avoid nephrotoxins - Monitor closely, stable  DNR status: POA.   Low grade fever overnight: No leukocytosis. Suspect postoperative/inflammatory fever in absence of urinary, pulmonary or cutaneous symptoms/infection sources noted. - Incentive spirometry - Monitor closely off antibiotics for now  Subjective: Pain controlled immediately post op. Fentanyl made her nauseated, about to get  antiemetic. Ready to do PT/OT. Pt and family consider home vs. SNF.   Objective: Vitals:   05/05/22 2045 05/05/22 2242 05/06/22 0119 05/06/22 0603  BP: (!) 163/59 (!) 161/56 (!) 155/54 (!) 163/54  Pulse: (!) 103 (!) 109 (!) 102 93  Resp: 18 18 18 18   Temp: (!) 100.5 F (38.1 C) 99.9 F (37.7 C) 100.2 F (37.9 C) 98.7 F (37.1 C)  TempSrc: Oral Oral Oral Oral  SpO2: 97% 96% 94% 97%  Weight:      Height:       Gen: 88 y.o. female in no distress Pulm: Nonlabored breathing room air. Clear. CV: Regular rate and rhythm. No murmur, rub, or gallop. No JVD, no dependent edema. GI: Abdomen soft, non-tender, non-distended, with normoactive bowel sounds.  Ext: Warm, no deformities Skin: Right posterolateral thigh dressing aquacell remains c/d/I, no surrounding erythema/hemorrhage. Right elbow ecchymosis stable. No new rashes, lesions or ulcers on visualized skin. Neuro: Alert and oriented. No focal neurological deficits. Psych: Judgement and insight appear fair. Mood euthymic & affect congruent. Behavior is appropriate.    Data Personally reviewed: CBC: Recent Labs  Lab 05/03/22 1524 05/04/22 0556 05/05/22 0152 05/06/22 0550  WBC 10.1 8.2 7.3 9.3  NEUTROABS 8.2*  --   --   --   HGB 11.2* 10.9* 10.7* 9.7*  HCT 33.4* 31.1* 30.8* 27.3*  MCV 91.8 88.6 89.8 88.6  PLT 150 158 144* 132*   Basic Metabolic Panel: Recent Labs  Lab 05/03/22 1524 05/04/22 0556 05/05/22 0152 05/06/22 0550  NA 134* 131* 132* 130*  K 3.9 4.2 3.9 3.9  CL 101 101 103 101  CO2 25 22 21*  20*  GLUCOSE 160* 129* 122* 136*  BUN 24* 19 15 12   CREATININE 1.15* 1.08* 1.04* 1.03*  CALCIUM 8.9 8.8* 8.7* 8.2*   DG HIP UNILAT W OR W/O PELVIS 2-3 VIEWS RIGHT 05/05/2022 Well-positioned total right hip arthroplasty.    CT Hip Right Wo Contrast 05/03/2022 Intertrochanteric right femur fracture.    Family Communication: Daughter, 05/05/2022, at bedside  Disposition: Remains inpatient appropriate because: Operative management  of hip fracture, disposition per postoperative PT/OT recommendations    Alvino Chapel, MD 05/06/2022 8:06 AM Page by [email or web address]

## 2022-05-06 NOTE — Evaluation (Signed)
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  She states she tripped over the boxes landing to her right side; R hip fx, s/p R THA on 8/19, WBAT, and no formal hip precautions;  has a past medical history of GERD (gastroesophageal reflux disease), Hypertension, Hypothyroidism, and Thyroid disease.   Clinical Impression   Pt admitted for concerns and procedure listed above. PTA pt reported that she was independent with all ADL's and IADL's. At this time, pt is limited by pain, weakness, balance deficits. She is requiring up to mod A for ADL's and functional mobility, using a RW. Pt has increased pain in sitting and leaning forward. Recommending returning home with [facility] and 24/7 family support. OT will follow acutely.       Recommendations for follow up therapy are one component of a multi-disciplinary discharge planning process, led by the attending physician.  Recommendations may be updated based on patient status, additional functional criteria and insurance authorization.   Follow Up Recommendations  Home health OT    Assistance Recommended at Discharge Frequent or constant Supervision/Assistance  Patient can return home with the following A lot of help with walking and/or transfers;A lot of help with bathing/dressing/bathroom;Assistance with cooking/housework;Help with stairs or ramp for entrance    Functional Status Assessment  Patient has had a recent decline in their functional status and demonstrates the ability to make significant improvements in function in a reasonable and predictable amount of time.  Equipment Recommendations  Tub/shower bench;Other (comment) (RW)    Recommendations for Other Services       Precautions /  Restrictions Precautions Precautions: Fall Precaution Comments: Check Orhtostatics Restrictions Weight Bearing Restrictions: No RLE Weight Bearing: Weight bearing as tolerated      Mobility Bed Mobility Overal bed mobility: Needs Assistance Bed Mobility: Supine to Sit     Supine to sit: Mod assist     General bed mobility comments: Cues for technqiue; good half bridge to EOB with LLE; light mod handheld assist to pull to sit    Transfers Overall transfer level: Needs assistance Equipment used: Rolling walker (2 wheels) Transfers: Sit to/from Stand Sit to Stand: Mod assist           General transfer comment: Light mod assist to steady as pt moved her hands for bed to RW;      Balance Overall balance assessment: Needs assistance Sitting-balance support: Feet supported Sitting balance-Leahy Scale: Fair     Standing balance support: Bilateral upper extremity supported Standing balance-Leahy Scale: Poor                             ADL either performed or assessed with clinical judgement   ADL Overall ADL's : Needs assistance/impaired Eating/Feeding: Set up;Sitting   Grooming: Set up;Sitting   Upper Body Bathing: Set up;Sitting   Lower Body Bathing: Moderate assistance;Sitting/lateral leans;Sit to/from stand   Upper Body Dressing : Set up;Sitting   Lower Body Dressing: Moderate assistance;Sitting/lateral leans;Sit to/from stand   Toilet Transfer: Moderate assistance;Stand-pivot   Toileting- Clothing Manipulation and Hygiene: Moderate assistance;Sitting/lateral lean;Sit to/from stand       Functional mobility during ADLs: Moderate assistance;Rolling walker (2 wheels)       Vision Baseline Vision/History:  1 Wears glasses Ability to See in Adequate Light: 0 Adequate Patient Visual Report: No change from baseline Vision Assessment?: No apparent visual deficits     Perception     Praxis      Pertinent Vitals/Pain Pain Assessment Pain  Assessment: 0-10 Pain Score: 6  Pain Location: R hip with motion Pain Descriptors / Indicators: Aching, Grimacing, Guarding Pain Intervention(s): Monitored during session, Repositioned, Patient requesting pain meds-RN notified, RN gave pain meds during session     Hand Dominance Right   Extremity/Trunk Assessment Upper Extremity Assessment Upper Extremity Assessment: Overall WFL for tasks assessed   Lower Extremity Assessment Lower Extremity Assessment: Defer to PT evaluation   Cervical / Trunk Assessment Cervical / Trunk Assessment: Normal   Communication Communication Communication: HOH   Cognition Arousal/Alertness: Awake/alert Behavior During Therapy: WFL for tasks assessed/performed Overall Cognitive Status: Within Functional Limits for tasks assessed                                       General Comments  VSS on RA    Exercises     Shoulder Instructions      Home Living Family/patient expects to be discharged to:: Private residence Living Arrangements: Alone (Daughters) Available Help at Discharge: Family;Available 24 hours/day Type of Home: House Home Access: Stairs to enter [employer] of Steps: 1   Home Layout: One level     Bathroom Shower/Tub: Chief Strategy Officer: Handicapped height     Home Equipment: None          Prior Functioning/Environment Prior Level of Function : Independent/Modified Independent             Mobility Comments: No difficulty, no assistive device ADLs Comments: Indep        OT Problem List: Decreased strength;Decreased activity tolerance;Impaired balance (sitting and/or standing);Decreased knowledge of use of DME or AE;Pain      OT Treatment/Interventions: Self-care/ADL training;Therapeutic exercise;Energy conservation;DME and/or AE instruction;Therapeutic activities;Patient/family education;Balance training    OT Goals(Current goals can be found in the care plan  section) Acute Rehab OT Goals Patient Stated Goal: To get back to normal OT Goal Formulation: With patient Time For Goal Achievement: 05/20/22 Potential to Achieve Goals: Good ADL Goals Pt Will Perform Grooming: with modified independence;standing Pt Will Perform Lower Body Bathing: with min assist;with adaptive equipment;sitting/lateral leans;sit to/from stand Pt Will Perform Lower Body Dressing: with min assist;sitting/lateral leans;sit to/from stand;with adaptive equipment Pt Will Transfer to Toilet: with supervision;ambulating Pt Will Perform Toileting - Clothing Manipulation and hygiene: with supervision;with adaptive equipment;sitting/lateral leans;sit to/from stand  OT Frequency: Min 2X/week    Co-evaluation              AM-PAC OT "6 Clicks" Daily Activity     Outcome Measure Help from another person eating meals?: A Little Help from another person taking care of personal grooming?: A Little Help from another person toileting, which includes using toliet, bedpan, or urinal?: A Lot Help from another person bathing (including washing, rinsing, drying)?: A Lot Help from another person to put on and taking off regular upper body clothing?: A Little Help from another person to put on and taking off regular lower body clothing?: A Lot 6 Click Score: 15   End of Session Equipment Utilized During Treatment: Gait belt;Rolling walker (2 wheels) Nurse Communication: Mobility status  Activity Tolerance: Patient tolerated treatment well Patient left: in  bed;with call bell/phone within reach;with family/visitor present  OT Visit Diagnosis: Unsteadiness on feet (R26.81);Other abnormalities of gait and mobility (R26.89);Muscle weakness (generalized) (M62.81);Pain Pain - Right/Left: Left Pain - part of body: Hip                Time: 4098-1191 OT Time Calculation (min): 54 min Charges:  OT General Charges $OT Visit: 1 Visit OT Evaluation $OT Eval Moderate Complexity: 1 Mod OT  Treatments $Self Care/Home Management : 8-22 mins $Therapeutic Activity: 23-37 mins  Merian Wroe H., OTR/L Acute Rehabilitation  Kaseem Vastine Elane Bing Plume 05/06/2022, 6:57 PM

## 2022-05-06 NOTE — Progress Notes (Signed)
Orthopaedic Trauma Service Progress Note Weekend Coverage  Patient ID: Olivia Ramsey MRN: [id number] DOB/AGE: 04-14-1931 88 y.o.  Subjective:  Doing great No pain  Up in chair Did get a little lightheaded with therapy   Wants to go home not a snf    ROS As above  Objective:   VITALS:   Vitals:   05/05/22 2242 05/06/22 0119 05/06/22 0603 05/06/22 0915  BP: (!) 161/56 (!) 155/54 (!) 163/54 (!) 148/50  Pulse: (!) 109 (!) 102 93 82  Resp: 18 18 18 18   Temp: 99.9 F (37.7 C) 100.2 F (37.9 C) 98.7 F (37.1 C) 98.7 F (37.1 C)  TempSrc: Oral Oral Oral Oral  SpO2: 96% 94% 97% 96%  Weight:      Height:        Estimated body mass index is 20.66 kg/m as calculated from the following:   Height as of this encounter: 5\' 6"  (1.676 m).   Weight as of this encounter: 58.1 kg.   Intake/Output      08/19 0701 08/20 0700 08/20 0701 08/21 0700   P.O. 100    I.V. (mL/kg) 3645 (62.7)    Total Intake(mL/kg) 3745 (64.5)    Urine (mL/kg/hr) 1250 (0.9)    Blood 150    Total Output 1400    Net +2345           LABS  Results for orders placed or performed during the hospital encounter of 05/03/22 (from the past 24 hour(s))  VITAMIN D 25 Hydroxy (Vit-D Deficiency, Fractures)     Status: None   Collection Time: 05/06/22  5:50 AM  Result Value Ref Range   Vit D, 25-Hydroxy 43.38 30 - 100 ng/mL  CBC     Status: Abnormal   Collection Time: 05/06/22  5:50 AM  Result Value Ref Range   WBC 9.3 4.0 - 10.5 K/uL   RBC 3.08 (L) 3.87 - 5.11 MIL/uL   Hemoglobin 9.7 (L) 12.0 - 15.0 g/dL   HCT 05/08/22 (L) 05/08/22 - 37.1 %   MCV 88.6 80.0 - 100.0 fL   MCH 31.5 26.0 - 34.0 pg   MCHC 35.5 30.0 - 36.0 g/dL   RDW 69.6 78.9 - 38.1 %   Platelets 132 (L) 150 - 400 K/uL   nRBC 0.0 0.0 - 0.2 %  Basic metabolic panel     Status: Abnormal   Collection Time: 05/06/22  5:50 AM  Result Value Ref Range   Sodium 130 (L) 135 - 145  mmol/L   Potassium 3.9 3.5 - 5.1 mmol/L   Chloride 101 98 - 111 mmol/L   CO2 20 (L) 22 - 32 mmol/L   Glucose, Bld 136 (H) 70 - 99 mg/dL   BUN 12 8 - 23 mg/dL   Creatinine, Ser 51.0 (H) 0.44 - 1.00 mg/dL   Calcium 8.2 (L) 8.9 - 10.3 mg/dL   GFR, Estimated 52 (L) >60 mL/min   Anion gap 9 5 - 15     PHYSICAL EXAM:   Gen: awake, sitting up in chair, NAD, pleasant  Lungs: unlabored Ext:       Right Lower Extremity   Dressing clean, dry and intact to R hip   Sens DPN, SPN, TN intact  Motor EHL, FHL, lesser toe motor, Ext, flex, evers 5/5  Extremity  warm  + DP pulse  No DCT             Compartments are soft and nontender, no pain with passive stretching  Swelling minimal   Assessment/Plan: 1 Day Post-Op     Anti-infectives (From admission, onward)    Start     Dose/Rate Route Frequency Ordered Stop   05/05/22 1400  ceFAZolin (ANCEF) IVPB 2g/100 mL premix        2 g 200 mL/hr over 30 Minutes Intravenous Every 8 hours 05/05/22 1134 05/05/22 2310   05/05/22 0730  ceFAZolin (ANCEF) IVPB 2g/100 mL premix        2 g 200 mL/hr over 30 Minutes Intravenous On call to O.R. 05/05/22 1610 05/05/22 0815   05/05/22 0728  ceFAZolin (ANCEF) 2-4 GM/100ML-% IVPB       Note to Pharmacy: Kathrene Bongo D: cabinet override      05/05/22 0728 05/05/22 1455     .  POD/HD#: 1  88 y/o female with R femoral neck fracture s/p R THA   -R femoral neck fracture s/p R THA  WBAT R leg with assistance  No ROM restrictions   Dressing left in place until follow up   PT/OT  Ice PRN pain/swelling  - Pain management:  Multimodal   - ABL anemia/Hemodynamics  Stable  Monitor   Suspect lightheadedness was orthostatic related to prolong bed rest    Cbc in am   - Medical issues   Per primary   - DVT/PE prophylaxis:  Aspirin 81 mg po BID   - ID:   Periop abx   - Dispo:  Ortho issues stable  Continue with current care   Post op recs: WB: WBAT RLE, No formal hip precautions Abx:  ancef Dressing: Aquacell, keep intact until follow up DVT prophylaxis: Aspirin 81BID starting POD1 Follow up: 2 weeks after surgery for a wound check with Dr. Blanchie Dessert at [facility].  Address: [address], [place], [place] [address]  Office Phone: [phone]    Mearl Latin, PA-C [phone] Salena Saner) 05/06/2022, 1:13 PM  [facility] [address] [place] [address] [phone] Val [phone] (F)    After 5pm and on the weekends please log on to Amion, go to orthopaedics and the look under the [facility] Call for the provider(s) on call. You can also call our office at [phone] and then follow the prompts to be connected to the call team.   Patient ID: Olivia Ramsey, female   DOB: 09-06-1931, 88 y.o.   MRN: [id number]

## 2022-05-06 NOTE — Evaluation (Signed)
Physical Therapy Evaluation Patient Details Name: Olivia Ramsey MRN: [id number] DOB: 1931/08/28 Today's Date: 05/06/2022  History of Present Illness  Olivia Ramsey is a 88 y.o. 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  She states she tripped over the boxes landing to her right side; R hip fx, s/p R THA on 8/19, WBAT, and no formal hip precautions;  has a past medical history of GERD (gastroesophageal reflux disease), Hypertension, Hypothyroidism, and Thyroid disease.  Clinical Impression   Pt admitted with above diagnosis. Lives at home alone, in a single-level home with 1 steps to enter; Prior to admission, pt was independent; Presents to PT with decr AROM and strength R hip, decr activity tolerance, with pre-syncopal symptoms with upright and standing activities today; (see other PT note of this date and vitals flowsheet); Overall, the R hip is moving well and accepting weight -- pt's ability to walk was limited by dzziness; as her potentnial orthsotasis stabilizes, I anticipate good progress;  Pt currently with functional limitations due to the deficits listed below (see PT Problem List). Pt will benefit from skilled PT to increase their independence and safety with mobility to allow discharge to the venue listed below.   i       Recommendations for follow up therapy are one component of a multi-disciplinary discharge planning process, led by the attending physician.  Recommendations may be updated based on patient status, additional functional criteria and insurance authorization.  Follow Up Recommendations Home health PT (Can consider post-acute rehab if slower than anticipated progress)      Assistance Recommended at Discharge Set up Supervision/Assistance  Patient can return home with the following  A lot of help with walking and/or transfers;Assistance with cooking/housework;Assist for transportation;Help with  stairs or ramp for entrance    Equipment Recommendations Rolling walker (2 wheels);BSC/3in1  Recommendations for Other Services  OT consult    Functional Status Assessment Patient has had a recent decline in their functional status and demonstrates the ability to make significant improvements in function in a reasonable and predictable amount of time.     Precautions / Restrictions Precautions Precautions: Fall Precaution Comments: Check Orhtostatics Restrictions RLE Weight Bearing: Weight bearing as tolerated      Mobility  Bed Mobility Overal bed mobility: Needs Assistance Bed Mobility: Supine to Sit     Supine to sit: Mod assist     General bed mobility comments: Cues for technqiue; good half bridge to EOB with LLE; light mod handheld assist to pull to sit    Transfers Overall transfer level: Needs assistance Equipment used: Rolling walker (2 wheels) Transfers: Sit to/from Stand, Bed to chair/wheelchair/BSC Sit to Stand: Mod assist   Step pivot transfers: Mod assist       General transfer comment: Light mod assist to steady as pt moved her hands for bed to RW; symptomatic for dizziness very soon after standing; Eyes closed much of the step pivot transfer bed to chair, and pt unable to open them when asked to    Ambulation/Gait               General Gait Details: Amb distance limited by syncopal symptoms  Stairs            Wheelchair Mobility    Modified Rankin (Stroke Patients Only)       Balance  Pertinent Vitals/Pain Pain Assessment Pain Assessment: 0-10 Pain Score: 5  Pain Location: R hip with motion Pain Descriptors / Indicators: Aching, Grimacing, Guarding Pain Intervention(s): RN gave pain meds during session, Monitored during session, Limited activity within patient's tolerance    Home Living Family/patient expects to be discharged to:: Private residence Living  Arrangements: Alone (Daughters) Available Help at Discharge: Family;Available 24 hours/day Type of Home: House Home Access: Stairs to enter   [employer] of Steps: 1   Home Layout: One level Home Equipment: None      Prior Function Prior Level of Function : Independent/Modified Independent             Mobility Comments: No difficulty, no assistive device       Hand Dominance        Extremity/Trunk Assessment   Upper Extremity Assessment Upper Extremity Assessment: Defer to OT evaluation    Lower Extremity Assessment Lower Extremity Assessment: RLE deficits/detail RLE Deficits / Details: Grossly decr AROM and strength, limited by pain postop       Communication   Communication: HOH  Cognition Arousal/Alertness: Awake/alert Behavior During Therapy: WFL for tasks assessed/performed Overall Cognitive Status: Within Functional Limits for tasks assessed                                          General Comments General comments (skin integrity, edema, etc.): Orthostatic with standing activity today; see other PT note of this date    Exercises Total Joint Exercises Ankle Circles/Pumps: AROM, Both, 20 reps Quad Sets: AROM, Right, 5 reps Gluteal Sets: AROM, Both, 5 reps Towel Squeeze: AROM, Both, 5 reps Heel Slides: AAROM, Right, 5 reps Hip ABduction/ADduction: AAROM, Right, 5 reps   Assessment/Plan    PT Assessment Patient needs continued PT services  PT Problem List Decreased strength;Decreased range of motion;Decreased activity tolerance;Decreased balance;Decreased mobility;Decreased knowledge of use of DME;Decreased safety awareness;Decreased knowledge of precautions;Cardiopulmonary status limiting activity;Pain       PT Treatment Interventions DME instruction;Gait training;Stair training;Functional mobility training;Therapeutic activities;Therapeutic exercise;Balance training;Patient/family education    PT Goals (Current goals  can be found in the Care Plan section)  Acute Rehab PT Goals Patient Stated Goal: Back to independence PT Goal Formulation: With patient Time For Goal Achievement: 05/13/22 Potential to Achieve Goals: Good    Frequency Min 6X/week     Co-evaluation               AM-PAC PT "6 Clicks" Mobility  Outcome Measure Help needed turning from your back to your side while in a flat bed without using bedrails?: A Little Help needed moving from lying on your back to sitting on the side of a flat bed without using bedrails?: A Lot Help needed moving to and from a bed to a chair (including a wheelchair)?: A Little Help needed standing up from a chair using your arms (e.g., wheelchair or bedside chair)?: A Little Help needed to walk in hospital room?: A Lot Help needed climbing 3-5 steps with a railing? : A Lot 6 Click Score: 15    End of Session Equipment Utilized During Treatment: Gait belt Activity Tolerance: Patient tolerated treatment well Patient left: in chair;with call bell/phone within reach;with chair alarm set;with family/visitor present Nurse Communication: Mobility status PT Visit Diagnosis: Unsteadiness on feet (R26.81);Other abnormalities of gait and mobility (R26.89)    Time: [id number] PT Time Calculation (min) (ACUTE  ONLY): 51 min   Charges:   PT Evaluation $PT Eval Moderate Complexity: 1 Mod PT Treatments $Therapeutic Exercise: 8-22 mins $Therapeutic Activity: 8-22 mins        Van Clines, PT  Acute Rehabilitation Services Office [phone]   Levi Aland 05/06/2022, 1:59 PM

## 2022-05-07 ENCOUNTER — Encounter (HOSPITAL_COMMUNITY): Payer: Self-pay | Admitting: Orthopedic Surgery

## 2022-05-07 DIAGNOSIS — E079 Disorder of thyroid, unspecified: Secondary | ICD-10-CM | POA: Diagnosis not present

## 2022-05-07 DIAGNOSIS — N1831 Chronic kidney disease, stage 3a: Secondary | ICD-10-CM | POA: Diagnosis not present

## 2022-05-07 DIAGNOSIS — S72001A Fracture of unspecified part of neck of right femur, initial encounter for closed fracture: Secondary | ICD-10-CM | POA: Diagnosis not present

## 2022-05-07 LAB — CBC
HCT: 23.3 % — ABNORMAL LOW (ref 36.0–46.0)
Hemoglobin: 8.1 g/dL — ABNORMAL LOW (ref 12.0–15.0)
MCH: 31.2 pg (ref 26.0–34.0)
MCHC: 34.8 g/dL (ref 30.0–36.0)
MCV: 89.6 fL (ref 80.0–100.0)
Platelets: 128 10*3/uL — ABNORMAL LOW (ref 150–400)
RBC: 2.6 MIL/uL — ABNORMAL LOW (ref 3.87–5.11)
RDW: 12.3 % (ref 11.5–15.5)
WBC: 8.6 10*3/uL (ref 4.0–10.5)
nRBC: 0 % (ref 0.0–0.2)

## 2022-05-07 NOTE — Progress Notes (Addendum)
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  She states she tripped over the boxes landing to her right side; R hip fx, s/p R THA on 8/19, WBAT, and no formal hip precautions;  has a past medical history of GERD (gastroesophageal reflux disease), Hypertension, Hypothyroidism, and Thyroid disease.    PT Comments    Pt received in recliner, pleasantly agreeable to therapy session with emphasis on gradual progression of mobility within tolerance, self-monitoring for symptoms of orthostatic hypotension, seated/supine RLE exercises post-op and fall risk prevention. Pt needing up to minA with mod cues for safety with transfers and short distance gait task at bedside using RW, distance limited due to pt with evolving symptoms of orthostatic hypotension and BP MAP noted to be soft with standing tasks. Handout brought to room to reinforce LE HEP post-op. Pt continues to benefit from PT services to progress toward functional mobility goals. Pt may benefit from thigh-high TED hose for improved hemodynamics post-op given drop in DBP and MAP. Disposition updated below per discussion with pt and supervising PT Alexa S. Orthostatic BPs Sitting 167/60 (92) HR 89  Standing 149/28 (64) HR 90  Sitting after standing 136/45 (66) HR 86  Supine post-exertion 176/67 (90) HR 87       Recommendations for follow up therapy are one component of a multi-disciplinary discharge planning process, led by the attending physician.  Recommendations may be updated based on patient status, additional functional criteria and insurance authorization.  Follow Up Recommendations  Skilled nursing-short term rehab (<3 hours/day) (pt/family agreeable)     Assistance Recommended at  Discharge Set up Supervision/Assistance  Patient can return home with the following A lot of help with walking and/or transfers;Assistance with cooking/housework;Assist for transportation;Help with stairs or ramp for entrance   Equipment Recommendations  Rolling walker (2 wheels);BSC/3in1 (orthostatic hypotension so cannot reliably ambulate to bathroom)    Recommendations for Other Services       Precautions / Restrictions Precautions Precautions: Fall Precaution Comments: symptomatic orthostatic hypotension Restrictions Weight Bearing Restrictions: Yes RLE Weight Bearing: Weight bearing as tolerated     Mobility  Bed Mobility Overal bed mobility: Needs Assistance Bed Mobility: Sit to Supine     Supine to sit: Min assist     General bed mobility comments: Cues for technqiue; use of bed features and RLE assist to clear edge of bed    Transfers Overall transfer level: Needs assistance Equipment used: Rolling walker (2 wheels) Transfers: Sit to/from Stand Sit to Stand: Min assist, From elevated surface           General transfer comment: from chair<>RW with arm rests and slightly elevated padded surface; increased time to rise    Ambulation/Gait Ambulation/Gait assistance: Min assist Gait Distance (Feet): 18 Feet (53ft, seated break, 54ft) Assistive device: Rolling walker (2 wheels) Gait Pattern/deviations: Decreased stride length, Decreased weight shift to right       General Gait Details: distance limited due to lightheadedness and c/o fatigue and noted soft BP when checked in standing so defer longer distances; seated break when fatigued. HR/SpO2 WFL on RA, see BP below.      Balance Overall balance assessment: Needs assistance Sitting-balance support: Feet supported Sitting balance-Leahy Scale: Fair     Standing balance support: Bilateral  upper extremity supported Standing balance-Leahy Scale: Poor Standing balance comment: heavily reliant on RW during  gait, light BUE reliance on RW with static standing                            Cognition Arousal/Alertness: Awake/alert Behavior During Therapy: WFL for tasks assessed/performed Overall Cognitive Status: Within Functional Limits for tasks assessed                                          Exercises Total Joint Exercises Ankle Circles/Pumps: AROM, Both, 20 reps Long Arc Quad: AROM, Both, 10 reps, Seated Marching in Standing: AROM, Both, 5 reps, Standing    General Comments General comments (skin integrity, edema, etc.): BP 167/70 (92) sitting in recliner; BP 149/28 (64) standing, asymptomatic; BP 136/45 (66) sitting after short distance ambulation back to bed; BP 176/67 (90) supine post-exertion; HR 86-90 bpm with BP checks.      Pertinent Vitals/Pain Pain Assessment Pain Assessment: Faces Faces Pain Scale: Hurts a little bit Pain Location: R hip with motion Pain Descriptors / Indicators: Discomfort, Sore Pain Intervention(s): Monitored during session, Repositioned, Premedicated before session (pt given morphine earlier in day per her report)           PT Goals (current goals can now be found in the care plan section) Acute Rehab PT Goals Patient Stated Goal: Back to independence PT Goal Formulation: With patient Time For Goal Achievement: 05/13/22 Progress towards PT goals: Progressing toward goals    Frequency    Min 6X/week      PT Plan Current plan remains appropriate       AM-PAC PT "6 Clicks" Mobility   Outcome Measure  Help needed turning from your back to your side while in a flat bed without using bedrails?: A Little Help needed moving from lying on your back to sitting on the side of a flat bed without using bedrails?: A Lot (anticipated without rails) Help needed moving to and from a bed to a chair (including a wheelchair)?: A Little Help needed standing up from a chair using your arms (e.g., wheelchair or bedside chair)?: A  Little Help needed to walk in hospital room?: A Lot (chair follow for safety) Help needed climbing 3-5 steps with a railing? : Total 6 Click Score: 14    End of Session Equipment Utilized During Treatment: Gait belt Activity Tolerance: Patient tolerated treatment well;Treatment limited secondary to medical complications (Comment);Other (comment) (symptomatic decrease in BP MAP limiting gait distance, RN/MD notified) Patient left: in bed;with call bell/phone within reach;with bed alarm set;with SCD's reapplied;Other (comment) (heels floated) Nurse Communication: Mobility status;Other (comment) (pt needs sleeve for R SCD and purewick replaced) PT Visit Diagnosis: Unsteadiness on feet (R26.81);Other abnormalities of gait and mobility (R26.89)     Time: 6789-3810 PT Time Calculation (min) (ACUTE ONLY): 31 min  Charges:  $Gait Training: 8-22 mins $Therapeutic Activity: 8-22 mins                     Javyn Havlin P., PTA Acute Rehabilitation Services Secure Chat Preferred 9a-5:30pm Office: [phone]    Angus Palms 05/07/2022, 3:38 PM

## 2022-05-07 NOTE — Plan of Care (Signed)

## 2022-05-07 NOTE — Progress Notes (Signed)
Mobility Specialist Progress Note:   05/07/22 1230  Orthostatic Lying   BP- Lying 169/52  Pulse- Lying 83  Orthostatic Sitting  BP- Sitting 174/67  Pulse- Sitting 93  Orthostatic Standing at 0 minutes  BP- Standing at 0 minutes (!) 105/95  Orthostatic Standing at 3 minutes  BP- Standing at 3 minutes 142/64  Mobility  Activity Ambulated with assistance in room  Level of Assistance Moderate assist, patient does 50-74%  Assistive Device Front wheel walker  Distance Ambulated (ft) 2 ft  Activity Response Tolerated well  $Mobility charge 1 Mobility   Pt received in bed and agreeable. Per PT note, recorded orthostatic vitals. C/o nausea secondary to receiving pain medication. Asymptomatic w/ mobility. VSS although slight drop while standing initially. Pt left in chair with all needs met and call bell in reach.   Arwen Haseley Mobility Specialist-Acute Rehab Secure Chat only
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
## 2022-05-07 NOTE — Progress Notes (Signed)
Progress Note  Patient: Olivia Ramsey XNA:[id number] DOB: December 13, 1930  DOA: 05/03/2022  DOS: 05/07/2022    Brief hospital course: Olivia Ramsey is an active 88 y.o. female with a history of HTN, hypothyroidism who presented to the ED 8/17 after a fall at home found to have right hip fracture, transferred to [facility] where THA was performed 8/19.  Assessment and Plan: Closed right hip, intertrochanteric, fracture sustained from mechanical fall: Now s/p right THA, cemented stem, posterior approach 8/19 by Dr. Blanchie Dessert. Postoperative XR shows good positioning per radiology report.  - WBAT RLE, No formal hip precautions - Keep Aquacell dressing intact until follow up - Aspirin 81mg  po BID starting POD1 for VTE prophylaxis.  - Follow up 2 weeks after surgery for a wound check with Dr. at [facility] - Pain control ordered per orthopedics. Adequate without sedation. - Vitamin D supplement to continue. Level is 43.38.  - Postoperative PT/OT consults ordered. Pt with very good PTA activity tolerance for age. Considering their options Re: SNF vs. home w/supervision.  Acute perioperative blood loss anemia on chronic normocytic anemia:  - Hgb down as anticipated following surgery. Will recheck in AM, transfusion threshold of 7g/dl or if we think her orthostasis is due to anemia.   Thrombocytopenia: Isolated, mild.  - Recheck with CBC in AM, monitor for bleeding  Hyponatremia: Mild.  - Follow trend. Initiate work up if persistent at follow up.  HTN:  - Hold home losartan due to orthostatic vital signs that are symptomatic, prn hydralazine  Orthostatic hypotension: Pt clear that no symptoms of this preceded the fall and she's not had any passing out feelings. No chest pain or dyspnea or symptoms of heart failure either.  - No murmur on exam, will defer echo for now.  - Hold home losartan for now and monitor VS's.   Hypothyroidism:  - Continue synthroid stable dose.   Stage  IIIa CKD: Will adjust diagnosis based on trends if needed. Cr 1.04.  - Avoid nephrotoxins - Monitor closely, remains stable.  DNR status: POA.   Low grade fever overnight: No leukocytosis. Suspect postoperative/inflammatory fever in the continued absence of urinary, pulmonary or cutaneous symptoms/infection sources noted. - Incentive spirometry emphasized - Monitor closely off antibiotics for now.  Subjective: Has some nausea with pain meds which is her usual. Pain overall tolerable and she's eager to work with therapy. Feels like she's sweaty but no chills or subjective fever. No cough, dyspnea, dysuria, urinary frequency or urgency.   Objective: Vitals:   05/06/22 0603 05/06/22 0915 05/07/22 0001 05/07/22 0733  BP: (!) 163/54 (!) 148/50 (!) 124/44 (!) 147/49  Pulse: 93 82 90 91  Resp: 18 18 16    Temp: 98.7 F (37.1 C) 98.7 F (37.1 C) 98.9 F (37.2 C) 100.2 F (37.9 C)  TempSrc: Oral Oral Oral Oral  SpO2: 97% 96% 96% 96%  Weight:      Height:       Gen: Pleasant, elderly, well-nourished female in no distress Pulm: Nonlabored breathing room air. Clear. CV: Regular rate and rhythm. No significant murmur, rub, or gallop. No JVD or hammer pulse, no dependent edema. GI: Abdomen soft, non-tender, non-distended, with normoactive bowel sounds.  Ext: Warm, no deformities Skin: Right posterolateral hip/thigh with aquacell appearing c/d/I with surrounding tenderness that is appropriate, no fluctuance, erythema, ecchymosis. Neuro: Alert and oriented. No focal neurological deficits. Psych: Judgement and insight appear fair. Mood euthymic & affect congruent. Behavior is appropriate.    Data  Personally reviewed: CBC: Recent Labs  Lab 05/03/22 1524 05/04/22 0556 05/05/22 0152 05/06/22 0550 05/07/22 0433  WBC 10.1 8.2 7.3 9.3 8.6  NEUTROABS 8.2*  --   --   --   --   HGB 11.2* 10.9* 10.7* 9.7* 8.1*  HCT 33.4* 31.1* 30.8* 27.3* 23.3*  MCV 91.8 88.6 89.8 88.6 89.6  PLT 150 158 144*  132* 128*   Basic Metabolic Panel: Recent Labs  Lab 05/03/22 1524 05/04/22 0556 05/05/22 0152 05/06/22 0550  NA 134* 131* 132* 130*  K 3.9 4.2 3.9 3.9  CL 101 101 103 101  CO2 25 22 21* 20*  GLUCOSE 160* 129* 122* 136*  BUN 24* 19 15 12   CREATININE 1.15* 1.08* 1.04* 1.03*  CALCIUM 8.9 8.8* 8.7* 8.2*   DG HIP UNILAT W OR W/O PELVIS 2-3 VIEWS RIGHT 05/05/2022 Well-positioned total right hip arthroplasty.    CT Hip Right Wo Contrast 05/03/2022 Intertrochanteric right femur fracture.    Family Communication: Daughter at bedside  Disposition: Remains inpatient appropriate because: Operative management of hip fracture, planning DC to SNF (vs. HH if progressing significantly in next 24 hours)   05/05/2022, MD 05/07/2022 2:33 PM Page by [email or web address]
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
## 2022-05-07 NOTE — Progress Notes (Signed)
     Subjective:  Patient reports pain as minimal. Reports has not needed pain meds since yesterday afternoon.  Worked with therapy yesterday but got lightheaded and dizzy.  Denies distal numbness and tingling.  Eager to continue to progress with therapy and hopeful to go home.  Objective:   VITALS:   Vitals:   05/06/22 0119 05/06/22 0603 05/06/22 0915 05/07/22 0001  BP: (!) 155/54 (!) 163/54 (!) 148/50 (!) 124/44  Pulse: (!) 102 93 82 90  Resp: 18 18 18 16   Temp: 100.2 F (37.9 C) 98.7 F (37.1 C) 98.7 F (37.1 C) 98.9 F (37.2 C)  TempSrc: Oral Oral Oral Oral  SpO2: 94% 97% 96% 96%  Weight:      Height:        Sensation intact distally Intact pulses distally Dorsiflexion/Plantar flexion intact Incision: dressing C/D/I Compartment soft   Lab Results  Component Value Date   WBC 8.6 05/07/2022   HGB 8.1 (L) 05/07/2022   HCT 23.3 (L) 05/07/2022   MCV 89.6 05/07/2022   PLT 128 (L) 05/07/2022   BMET    Component Value Date/Time   NA 130 (L) 05/06/2022 0550   K 3.9 05/06/2022 0550   CL 101 05/06/2022 0550   CO2 20 (L) 05/06/2022 0550   GLUCOSE 136 (H) 05/06/2022 0550   BUN 12 05/06/2022 0550   CREATININE 1.03 (H) 05/06/2022 0550   CALCIUM 8.2 (L) 05/06/2022 0550   GFRNONAA 52 (L) 05/06/2022 0550      Xray: Total hip arthroplasty components good position no adverse features  Assessment/Plan: 2 Days Post-Op   Principal Problem:   Closed right hip fracture (HCC) Active Problems:   Thyroid disease   Hypertension   Chronic kidney disease, stage 3a (HCC)  S/p R THA for FN fx 05/05/22  Post op recs: WB: WBAT RLE, No formal hip precautions Abx: ancef Imaging: PACU pelvis Xray Dressing: Aquacell, keep intact until follow up DVT prophylaxis: Aspirin 81BID starting POD1 Follow up: 2 weeks after surgery for a wound check with Dr. 05/07/22 at [facility].  Address: [address], [place], [place]  Office Phone: [phone]    [phone]  [phone] 05/07/2022, 6:59 AM   05/09/2022, MD  Contact information:   [phone] 7am-5pm epic message Dr. CHENIDPO, or call office for patient follow up: [phone] After hours and holidays please check [email or web address] for group call information for [facility]

## 2022-05-08 LAB — CBC
HCT: 23.6 % — ABNORMAL LOW (ref 36.0–46.0)
Hemoglobin: 8.5 g/dL — ABNORMAL LOW (ref 12.0–15.0)
MCH: 31.6 pg (ref 26.0–34.0)
MCHC: 36 g/dL (ref 30.0–36.0)
MCV: 87.7 fL (ref 80.0–100.0)
Platelets: 153 10*3/uL (ref 150–400)
RBC: 2.69 MIL/uL — ABNORMAL LOW (ref 3.87–5.11)
RDW: 12.2 % (ref 11.5–15.5)
WBC: 7.4 10*3/uL (ref 4.0–10.5)
nRBC: 0 % (ref 0.0–0.2)

## 2022-05-08 MED ORDER — OXYCODONE HCL 5 MG PO TABS
2.5000 mg | ORAL_TABLET | Freq: Four times a day (QID) | ORAL | 0 refills | Status: DC | PRN
Start: 2022-05-08 — End: 2022-05-10

## 2022-05-08 MED ORDER — ROPINIROLE HCL 0.5 MG PO TABS
0.5000 mg | ORAL_TABLET | Freq: Once | ORAL | Status: AC
  Administered 2022-05-08: 0.5 mg via ORAL
  Filled 2022-05-08: qty 1

## 2022-05-08 MED ORDER — ASPIRIN 81 MG PO TBEC: 81.0000 mg | DELAYED_RELEASE_TABLET | Freq: Two times a day (BID) | ORAL | Status: AC

## 2022-05-08 MED ORDER — HYDRALAZINE HCL 10 MG PO TABS
10.0000 mg | ORAL_TABLET | Freq: Four times a day (QID) | ORAL | Status: DC | PRN
  Administered 2022-05-09: 10 mg via ORAL
  Filled 2022-05-08 (×2): qty 1

## 2022-05-08 MED ORDER — SENNA 8.6 MG PO TABS: 1.0000 | ORAL_TABLET | Freq: Two times a day (BID) | ORAL | 0 refills | Status: AC

## 2022-05-08 MED ORDER — FERROUS SULFATE 325 (65 FE) MG PO TABS
325.0000 mg | ORAL_TABLET | Freq: Every day | ORAL | 0 refills | Status: DC
Start: 2022-05-08 — End: 2022-05-18

## 2022-05-08 MED ORDER — LOSARTAN POTASSIUM 100 MG PO TABS: 50.0000 mg | ORAL_TABLET | Freq: Every day | ORAL | Status: DC

## 2022-05-08 NOTE — Progress Notes (Addendum)
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  She states she tripped over the boxes landing to her right side; R hip fx, s/p R THA on 8/19, WBAT, and no formal hip precautions;  has a past medical history of GERD (gastroesophageal reflux disease), Hypertension, Hypothyroidism, and Thyroid disease.    PT Comments    Pt received in recliner, agreeable to therapy session and with good participation and tolerance for transfers and gait progression this date. With bilateral thigh high compression stockings, pt BP more stable this date and able to progress to household distance gait trial with RW and min guard to minA support. Pt needing increased time to initiate and perform transfers and mobility with safety cues. Pt continues to benefit from PT services to progress toward functional mobility goals.  Pt now safer to transport by car so updated below, discussed with supervising PT Lance Sell.  Recommendations for follow up therapy are one component of a multi-disciplinary discharge planning process, led by the attending physician.  Recommendations may be updated based on patient status, additional functional criteria and insurance authorization.  Follow Up Recommendations  Skilled nursing-short term rehab (<3 hours/day) Can patient physically be transported by private vehicle: Yes   Assistance Recommended at Discharge Set up Supervision/Assistance  Patient can return home with the following A lot of help with walking and/or transfers;Assistance with cooking/housework;Assist for transportation;Help with stairs or ramp for entrance   Equipment Recommendations  Rolling walker (2 wheels);BSC/3in1 (hx positive orthostatics so 3in1 for nightime toileting or if  symptoms of soft BP)    Recommendations for Other Services       Precautions / Restrictions Precautions Precautions: Fall Precaution Comments: symptomatic orthostatic hypotension Required Braces or Orthoses:  (Bilat thigh-high TED hose) Restrictions Weight Bearing Restrictions: Yes RLE Weight Bearing: Weight bearing as tolerated     Mobility      Transfers Overall transfer level: Needs assistance Equipment used: Rolling walker (2 wheels) Transfers: Sit to/from Stand Sit to Stand: Min assist, From elevated surface           General transfer comment: min lift assist with verbal cues for safety and hand placement    Ambulation/Gait Ambulation/Gait assistance: Min assist Gait Distance (Feet): 80 Feet Assistive device: Rolling walker (2 wheels) Gait Pattern/deviations: Decreased stride length, Decreased weight shift to right, Antalgic       General Gait Details: No c/o lightheadedness or significant fatigue with mobility, pt with good insight into energy level today and appropriately requesting break with increased fatigue. SpO2 and HR WFL       Balance Overall balance assessment: Needs assistance Sitting-balance support: Feet supported Sitting balance-Leahy Scale: Fair     Standing balance support: Single extremity supported, Bilateral upper extremity supported, During functional activity Standing balance-Leahy Scale: Poor Standing balance comment: static standing with U UE support during BP assessment, no LOB; bilateral UE support for dynamic tasks                            Cognition Arousal/Alertness: Awake/alert Behavior During Therapy: WFL for tasks assessed/performed Overall Cognitive Status: Within Functional Limits for tasks assessed        General Comments: oriented x4  Exercises Total Joint Exercises Ankle Circles/Pumps: AROM, Both, 10 reps, Supine (c/o R ankle pain)    General Comments General comments (skin integrity,  edema, etc.): BP 145/81 supine, then 161/70 sitting upright with feet on floor, then BP 142/66 standing at RW, no dizziness reported; HR 91 bpm standing, 80 bpm resting. SpO2 WFL on rA      Pertinent Vitals/Pain Pain Assessment Pain Assessment: Faces Faces Pain Scale: Hurts little more Pain Location: R hip with standing and R ankle increased soreness today Pain Descriptors / Indicators: Discomfort, Sore Pain Intervention(s): Limited activity within patient's tolerance, Monitored during session, Repositioned, Ice applied (ice to R hip, encouraged her to remove after 20-30 mins, can also place on R ankle PRN for 20 mins at a time)           PT Goals (current goals can now be found in the care plan section) Acute Rehab PT Goals Patient Stated Goal: Back to independence PT Goal Formulation: With patient Time For Goal Achievement: 05/13/22 Progress towards PT goals: Progressing toward goals    Frequency    Min 6X/week      PT Plan Current plan remains appropriate       AM-PAC PT "6 Clicks" Mobility   Outcome Measure  Help needed turning from your back to your side while in a flat bed without using bedrails?: A Little Help needed moving from lying on your back to sitting on the side of a flat bed without using bedrails?: A Lot (without rails) Help needed moving to and from a bed to a chair (including a wheelchair)?: A Little Help needed standing up from a chair using your arms (e.g., wheelchair or bedside chair)?: A Little Help needed to walk in hospital room?: A Little Help needed climbing 3-5 steps with a railing? : Total 6 Click Score: 15    End of Session Equipment Utilized During Treatment: Gait belt Activity Tolerance: Patient tolerated treatment well Patient left: in chair;with call bell/phone within reach;with chair alarm set;with family/visitor present Nurse Communication: Mobility status PT Visit Diagnosis: Unsteadiness on feet (R26.81);Other abnormalities of gait  and mobility (R26.89)     Time: 1107-1130 PT Time Calculation (min) (ACUTE ONLY): 23 min  Charges:  $Gait Training: 8-22 mins $Therapeutic Activity: 8-22 mins                     Vipul Cafarelli P., PTA Acute Rehabilitation Services Secure Chat Preferred 9a-5:30pm Office: [phone]    Dorathy Kinsman [facility] 05/08/2022, 12:14 PM

## 2022-05-08 NOTE — Progress Notes (Signed)
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  She states she tripped over the boxes landing to her right side; R hip fx, s/p R THA on 8/19, WBAT, and no formal hip precautions;  has a past medical history of GERD (gastroesophageal reflux disease), Hypertension, Hypothyroidism, and Thyroid disease.   OT comments  Patient received in supine and agreeable to OT session. Patient required verbal cues and min assist to get to EOB with increased time. Patient required verbal cues for hand placement and min assist to stand from EOB and ambulate to sink for grooming. Patient's BP checked while seated at sink with 153/50. Patient stood for grooming with BP at 129/49.  Patient able to perform all grooming tasks standing with seated rest breaks. Patient making good progress and could benefit from further OT with SNF to increase independence and safety with self care and functional transfers.    Recommendations for follow up therapy are one component of a multi-disciplinary discharge planning process, led by the attending physician.  Recommendations may be updated based on patient status, additional functional criteria and insurance authorization.    Follow Up Recommendations  Skilled nursing-short term rehab (<3 hours/day)    Assistance Recommended at Discharge Frequent or constant Supervision/Assistance  Patient can return home with the following  A lot of help with walking and/or transfers;A lot of help with bathing/dressing/bathroom;Assistance with cooking/housework;Help with stairs or ramp for entrance   Equipment Recommendations  Tub/shower bench;Other (comment) (RW)    Recommendations for Other Services      Precautions / Restrictions  Precautions Precautions: Fall Precaution Comments: symptomatic orthostatic hypotension Restrictions Weight Bearing Restrictions: Yes RLE Weight Bearing: Weight bearing as tolerated       Mobility Bed Mobility Overal bed mobility: Needs Assistance Bed Mobility: Supine to Sit     Supine to sit: Min assist     General bed mobility comments: verbal cues for rail use and min assist with increased time    Transfers Overall transfer level: Needs assistance Equipment used: Rolling walker (2 wheels) Transfers: Sit to/from Stand Sit to Stand: Min assist, From elevated surface     Step pivot transfers: Min assist     General transfer comment: min assist with verbal cues for safety and hand placement     Balance Overall balance assessment: Needs assistance Sitting-balance support: Feet supported Sitting balance-Leahy Scale: Fair     Standing balance support: Single extremity supported, Bilateral upper extremity supported, During functional activity Standing balance-Leahy Scale: Poor Standing balance comment: able to stand at sink with one extremity support for balance                           ADL either performed or assessed with clinical judgement   ADL Overall ADL's : Needs assistance/impaired     Grooming: Wash/dry hands;Wash/dry face;Oral care;Brushing hair;Min guard;Standing Grooming Details (indicate cue type and reason): 2 seated rest breaks during grooming tasks                 Toilet Transfer: Minimal assistance Toilet Transfer Details (indicate cue type and reason): simulated           General ADL Comments: Patient able to stand at sink for grooming but required  seated rest breaks    Extremity/Trunk Assessment              Vision       Perception     Praxis      Cognition Arousal/Alertness: Awake/alert Behavior During Therapy: WFL for tasks assessed/performed Overall Cognitive Status: Within Functional Limits for tasks  assessed                                 General Comments: oriented x4        Exercises      Shoulder Instructions       General Comments BP seated 153/50, standing 129/49    Pertinent Vitals/ Pain       Pain Assessment Pain Assessment: Faces Faces Pain Scale: Hurts a little bit Pain Location: R hip with motion Pain Descriptors / Indicators: Discomfort, Sore Pain Intervention(s): Monitored during session, Repositioned  Home Living                                          Prior Functioning/Environment              Frequency  Min 2X/week        Progress Toward Goals  OT Goals(current goals can now be found in the care plan section)  Progress towards OT goals: Progressing toward goals  Acute Rehab OT Goals Patient Stated Goal: get better OT Goal Formulation: With patient Time For Goal Achievement: 05/20/22 Potential to Achieve Goals: Good ADL Goals Pt Will Perform Grooming: with modified independence;standing Pt Will Perform Lower Body Bathing: with min assist;with adaptive equipment;sitting/lateral leans;sit to/from stand Pt Will Perform Lower Body Dressing: with min assist;sitting/lateral leans;sit to/from stand;with adaptive equipment Pt Will Transfer to Toilet: with supervision;ambulating Pt Will Perform Toileting - Clothing Manipulation and hygiene: with supervision;with adaptive equipment;sitting/lateral leans;sit to/from stand  Plan Discharge plan remains appropriate    Co-evaluation                 AM-PAC OT "6 Clicks" Daily Activity     Outcome Measure   Help from another person eating meals?: A Little Help from another person taking care of personal grooming?: A Little Help from another person toileting, which includes using toliet, bedpan, or urinal?: A Lot Help from another person bathing (including washing, rinsing, drying)?: A Lot Help from another person to put on and taking off regular upper body  clothing?: A Little Help from another person to put on and taking off regular lower body clothing?: A Lot 6 Click Score: 15    End of Session Equipment Utilized During Treatment: Gait belt;Rolling walker (2 wheels)  OT Visit Diagnosis: Unsteadiness on feet (R26.81);Other abnormalities of gait and mobility (R26.89);Muscle weakness (generalized) (M62.81);Pain Pain - Right/Left: Left Pain - part of body: Hip   Activity Tolerance Patient tolerated treatment well   Patient Left in chair;with call bell/phone within reach;with chair alarm set   Nurse Communication Mobility status        Time: 1761-6073 OT Time Calculation (min): 33 min  Charges: OT General Charges $OT Visit: 1 Visit OT Treatments $Self Care/Home Management : 23-37 mins  Alfonse Flavors, OTA Acute Rehabilitation Services  Office [phone]   Dewain Penning 05/08/2022, 10:18 AM

## 2022-05-08 NOTE — TOC Progression Note (Addendum)
Transition of Care [facility]) - Progression Note    Patient Details  Name: Olivia Ramsey MRN: [id number] Date of Birth: 01-14-1931  Transition of Care [facility]) CM/SW Contact  Janae Bridgeman, RN Phone Number: 05/08/2022, 1:44 PM  Clinical Narrative:    CM called and spoke with Eliezer Champagne, MSW at [facility] and [facility] is requesting a Peer-to-Peer with the attending physician.  I updated Dr. Jarvis Newcomer and asked that he contact [facility] and schedule a peer-to-peer with medical physician - by tomorrow at 12 noon - by calling [employer] at [phone] option 2, Ref # [id number].   Expected Discharge Plan: Skilled Nursing Facility Barriers to Discharge: Continued Medical Work up  Expected Discharge Plan and Services Expected Discharge Plan: Skilled Nursing Facility   Discharge Planning Services: CM Consult Post Acute Care Choice: Skilled Nursing Facility Living arrangements for the past 2 months: Single Family Home Expected Discharge Date: 05/08/22                                     Social Determinants of Health (SDOH) Interventions    Readmission Risk Interventions    05/07/2022    1:32 PM  Readmission Risk Prevention Plan  Post Dischage Appt Complete  Medication Screening Complete  Transportation Screening Complete

## 2022-05-08 NOTE — Progress Notes (Signed)
I was made aware of an insurance denial for SNF and immediately called the number for [facility]. The TOC note states to press option 2, but it's option 3 for the [facility] territory, and the reference # for this patient is actually [id number].   I waited on hold for 8 minutes before speaking with someone, then left my call back number with them for Dr. Lanell Matar to call back any time today. This was approximately 1:45pm. It's now 5:30pm and I have yet to get a call back. I called the number again just now and got a message that said "our office is currently closed."   It is ridiculous that SNF was declined for this patient, so I was hoping to get a call back to discuss this case. Unfortunately, I did not. This means the patient will remain in the hospital unnecessarily due to insurance delays and a different attending physician will need to complete the peer-to-peer tomorrow.   Hazeline Junker, MD 05/08/2022 5:37 PM

## 2022-05-08 NOTE — Progress Notes (Signed)
Mobility Specialist Progress Note:   05/08/22 1229  Mobility  Activity Ambulated with assistance in hallway  Level of Assistance Contact guard assist, steadying assist  Assistive Device Front wheel walker  RLE Weight Bearing WBAT  Distance Ambulated (ft) 250 ft  Activity Response Tolerated well  $Mobility charge 1 Mobility   Pt received in chair and agreeable. No complaints. Pt left in chair with all needs met and call bell in reach.   Itai Barbian Mobility Specialist-Acute Rehab Secure Chat only

## 2022-05-08 NOTE — TOC Progression Note (Signed)
Transition of Care [facility]) - Progression Note    Patient Details  Name: Olivia Ramsey MRN: [id number] Date of Birth: October 02, 1930  Transition of Care [facility]) CM/SW Contact  Janae Bridgeman, RN Phone Number: 05/08/2022, 10:41 AM  Clinical Narrative:    CM called and spoke with Jeri Lager, CM at [facility] and she is waiting on insurance authorization approval for patient's placement at this time.  I updated the patient's and family that the patient is medically stable for discharge but waiting on approval for insurance at this time.  Benancio Deeds will contact TOC when insurance is approved in the next 1-2 days.  The patient and family were updated.  The patient states that she would like her daughter to provide transportation to the facility by car once she is able to discharge.  CM will continue to follow the patient for discharge planning to [facility] - pending insurance.   Expected Discharge Plan: Skilled Nursing Facility Barriers to Discharge: Continued Medical Work up  Expected Discharge Plan and Services Expected Discharge Plan: Skilled Nursing Facility   Discharge Planning Services: CM Consult Post Acute Care Choice: Skilled Nursing Facility Living arrangements for the past 2 months: Single Family Home Expected Discharge Date: 05/08/22                                     Social Determinants of Health (SDOH) Interventions    Readmission Risk Interventions    05/07/2022    1:32 PM  Readmission Risk Prevention Plan  Post Dischage Appt Complete  Medication Screening Complete  Transportation Screening Complete

## 2022-05-08 NOTE — Discharge Summary (Signed)
Physician Discharge Summary   Patient: Olivia Ramsey MRN: [id number] DOB: [birth year]  Admit date:     05/03/2022  Discharge date: 05/09/22  Discharge Physician: Dorcas Carrow   PCP: Elfredia Nevins, MD   Recommendations at discharge:  Continue rehabilitation at [facility]. Follow up in 2 weeks with Dr. Blanchie Dessert at [facility].  Monitor orthostatic vital signs, started TED hose. No syncope/presyncopal symptoms noted.  Follow up with PCP 1-2 weeks post SNF discharge (anticipate patient doing well at rehab and returning home). Suggest monitoring renal function labs.  Discharge Diagnoses: Principal Problem:   Closed right hip fracture [facility]) Active Problems:   Thyroid disease   Hypertension   Chronic kidney disease, stage 3a [facility])  Hospital Course: Olivia Ramsey is an active 88 y.o. female with a history of HTN, hypothyroidism who presented to the ED 8/17 after a fall at home found to have right hip fracture, transferred to [facility] where THA was performed 8/19. Postoperatively had expected blood loss anemia not requiring transfusion and already progressing with therapy. She opts to enter more intensive rehab at [facility] prior to returning home. She's been orthostatic for which TED hose have been added and now resolved.   Assessment and Plan: Closed right hip, intertrochanteric, fracture sustained from mechanical fall: Now s/p right THA, cemented stem, posterior approach 8/19 by Dr. Blanchie Dessert. Postoperative XR shows good positioning per radiology report.  - WBAT RLE, No formal hip precautions - Keep Aquacell dressing intact until follow up - Aspirin 81mg  po BID starting POD1 for VTE prophylaxis. Duration of this is deferred to orthopedics. - Follow up 2 weeks after surgery for a wound check with Dr. at [facility] - Pain control: Pt prefers tylenol, will also write for prn low dose oxycodone for severe pain as she prefers to minimize sedating medications. - Vitamin D  supplement to continue. Level is 43.38.  - Postoperative PT/OT consults ordered. Pt with very good PTA activity tolerance for age.    Acute perioperative blood loss anemia on chronic normocytic anemia:  - Hgb down as anticipated following surgery, has stabilized at 8.5g/dl. Will start oral iron.    Thrombocytopenia: Transient and now resolved.   Hyponatremia: Mild.  - Follow trend. Initiate work up if persistent at follow up.   HTN:  - Decrease home losartan 100mg  > 50mg  due to orthostatic vital signs    Orthostatic hypotension: Pt clear that no symptoms of this preceded the fall and she's not had any passing out feelings. No chest pain or dyspnea or symptoms of heart failure either.  - No murmur on exam, will defer echo for now. Orthostatic symptoms have improved since she's gotten up more often. Start LE compression. If orthostasis continues outside perioperative period, consider further work up.  - Lowered losartan dose as above, but could restart if becomes significant hypertensive. Currently 144/51 with MAP of 77.    Hypothyroidism:  - Continue synthroid stable dose.    Stage IIIa CKD: Will adjust diagnosis based on trends if needed. Cr 1.04.  - Avoid nephrotoxins - Monitor closely, remains stable.   DNR status: POA.      Consultants: Orthopedics Procedures performed: THA  Disposition: Skilled nursing facility Diet recommendation:  Cardiac diet DISCHARGE MEDICATION: Allergies as of 05/09/2022   No Known Allergies      Medication List     TAKE these medications    acidophilus Caps capsule Take 1 capsule by mouth daily.   aspirin EC 81 MG tablet  Take 1 tablet (81 mg total) by mouth 2 (two) times daily. Swallow whole.   cholecalciferol 25 MCG (1000 UNIT) tablet Commonly known as: VITAMIN D3 Take 1,000 Units by mouth daily.   ferrous sulfate 325 (65 FE) MG tablet Take 1 tablet (325 mg total) by mouth daily.   levothyroxine 50 MCG tablet Commonly known as:  SYNTHROID Take by mouth.   losartan 100 MG tablet Commonly known as: COZAAR Take 0.5 tablets (50 mg total) by mouth daily. What changed: Ramsey much to take   oxyCODONE 5 MG immediate release tablet Commonly known as: Oxy IR/ROXICODONE Take 0.5-1 tablets (2.5-5 mg total) by mouth every 6 (six) hours as needed for up to 7 days for moderate pain or severe pain.   senna 8.6 MG Tabs tablet Commonly known as: SENOKOT Take 1 tablet (8.6 mg total) by mouth 2 (two) times daily.        Follow-up Information     Elfredia Nevins, MD. Schedule an appointment as soon as possible for a visit in 1 week(s).   Specialty: Internal Medicine Contact information: [address] [place] [address] [phone]         Joen Laura, MD. Schedule an appointment as soon as possible for a visit in 2 week(s).   Specialty: Orthopedic Surgery Why: For wound re-check, postoperative exam. Contact information: [address] [place] [address] [phone]                Discharge Exam: Ceasar Mons Weights   05/03/22 1342  Weight: 58.1 kg  BP (!) 160/56 (BP Location: Left Arm)   Pulse 91   Temp 98.1 F (36.7 C) (Oral)   Resp 16   Ht 5\' 6"  (1.676 m)   Wt 58.1 kg   SpO2 98%   BMI 20.66 kg/m   WDWN elderly female alert and pleasant in no distress sitting in chair RRR, no MRG or edema Clear, nonlabored Soft, NT, ND RLE NVI  Condition at discharge: good  The results of significant diagnostics from this hospitalization (including imaging, microbiology, ancillary and laboratory) are listed below for reference.   Imaging Studies: DG HIP UNILAT W OR W/O PELVIS 2-3 VIEWS RIGHT  Result Date: 05/05/2022 CLINICAL DATA:  Postop right hip replacement. EXAM: DG HIP (WITH OR WITHOUT PELVIS) 2-3V RIGHT COMPARISON:  05/04/2022. FINDINGS: New total right hip arthroplasty appears well seated and aligned. No acute fracture or evidence of an operative complication. IMPRESSION:  Well-positioned total right hip arthroplasty. Electronically Signed   By: 05/06/2022 M.D.   On: 05/05/2022 11:27   DG FEMUR PORT, MIN 2 VIEWS RIGHT  Result Date: 05/04/2022 CLINICAL DATA:  Right hip fracture, preoperative evaluation EXAM: RIGHT FEMUR PORTABLE 2 VIEW COMPARISON:  None Available. FINDINGS: Frontal and frogleg lateral views of the right femur are obtained on 5 images. The comminuted intertrochanteric right hip fracture seen previously is again identified, with slight varus angulation at the fracture site. No dislocation. The remainder of the visualized right femur is unremarkable. Mild right knee osteoarthritis. Prominent soft tissue swelling overlying the right hip fracture. IMPRESSION: 1. Intertrochanteric right hip fracture, with slight varus angulation at the fracture site. 2. Remaining portions of the right femur are unremarkable. Electronically Signed   By: 05/06/2022 M.D.   On: 05/04/2022 09:57   CT Hip Right Wo Contrast  Result Date: 05/03/2022 CLINICAL DATA:  Right hip pain after fall EXAM: CT OF THE RIGHT HIP WITHOUT CONTRAST TECHNIQUE: Multidetector CT imaging  of the right hip was performed according to the standard protocol. Multiplanar CT image reconstructions were also generated. RADIATION DOSE REDUCTION: This exam was performed according to the departmental dose-optimization program which includes automated exposure control, adjustment of the mA and/or kV according to patient size and/or use of iterative reconstruction technique. COMPARISON:  Radiographs earlier today FINDINGS: Bones/Joint/Cartilage Redemonstrated mildly-comminuted intertrochanteric right femur fracture. There is apex anterior angulation. Normal alignment of the femoral head within the acetabulum. Ligaments Suboptimally assessed by CT. Muscles and Tendons Unremarkable. Soft tissues Small amount of edema about the intertrochanteric fracture. IMPRESSION: Intertrochanteric right femur fracture. Electronically  Signed   By: Minerva Fester M.D.   On: 05/03/2022 17:24   DG Hip Unilat W or Wo Pelvis 2-3 Views Right  Result Date: 05/03/2022 CLINICAL DATA:  Trauma, fall EXAM: DG HIP (WITH OR WITHOUT PELVIS) 2-3V RIGHT COMPARISON:  None Available. FINDINGS: Intertrochanteric fracture is seen in the neck of right femur. There is offset in alignment of medial cortical margin. There is no dislocation. IMPRESSION: Intertrochanteric fracture of proximal right femur. Electronically Signed   By: Ernie Avena M.D.   On: 05/03/2022 15:05    Microbiology: Results for orders placed or performed during the hospital encounter of 05/03/22  Surgical PCR screen     Status: None   Collection Time: 05/04/22  3:03 AM   Specimen: Nasal Mucosa; Nasal Swab  Result Value Ref Range Status   MRSA, PCR NEGATIVE NEGATIVE Final   Staphylococcus aureus NEGATIVE NEGATIVE Final    Comment: (NOTE) The Xpert SA Assay (FDA approved for NASAL specimens in patients 14 years of age and older), is one component of a comprehensive surveillance program. It is not intended to diagnose infection nor to guide or monitor treatment. Performed at [facility], [address] [place], [place] [address]     Labs: CBC: Recent Labs  Lab 05/03/22 1524 05/04/22 0556 05/05/22 0152 05/06/22 0550 05/07/22 0433 05/08/22 0441  WBC 10.1 8.2 7.3 9.3 8.6 7.4  NEUTROABS 8.2*  --   --   --   --   --   HGB 11.2* 10.9* 10.7* 9.7* 8.1* 8.5*  HCT 33.4* 31.1* 30.8* 27.3* 23.3* 23.6*  MCV 91.8 88.6 89.8 88.6 89.6 87.7  PLT 150 158 144* 132* 128* 153   Basic Metabolic Panel: Recent Labs  Lab 05/03/22 1524 05/04/22 0556 05/05/22 0152 05/06/22 0550  NA 134* 131* 132* 130*  K 3.9 4.2 3.9 3.9  CL 101 101 103 101  CO2 25 22 21* 20*  GLUCOSE 160* 129* 122* 136*  BUN 24* 19 15 12   CREATININE 1.15* 1.08* 1.04* 1.03*  CALCIUM 8.9 8.8* 8.7* 8.2*   Liver Function Tests: No results for input(s): "AST", "ALT", "ALKPHOS", "BILITOT",  "PROT", "ALBUMIN" in the last 168 hours. CBG: No results for input(s): "GLUCAP" in the last 168 hours.  Discharge time spent: greater than 30 minutes.  Signed: , MD [facility] 05/09/2022

## 2022-05-09 ENCOUNTER — Encounter (HOSPITAL_COMMUNITY): Payer: Self-pay | Admitting: Orthopedic Surgery

## 2022-05-09 DIAGNOSIS — D62 Acute posthemorrhagic anemia: Secondary | ICD-10-CM | POA: Diagnosis not present

## 2022-05-09 DIAGNOSIS — D696 Thrombocytopenia, unspecified: Secondary | ICD-10-CM | POA: Diagnosis not present

## 2022-05-09 DIAGNOSIS — M6281 Muscle weakness (generalized): Secondary | ICD-10-CM | POA: Diagnosis not present

## 2022-05-09 DIAGNOSIS — E871 Hypo-osmolality and hyponatremia: Secondary | ICD-10-CM | POA: Diagnosis not present

## 2022-05-09 DIAGNOSIS — K5909 Other constipation: Secondary | ICD-10-CM | POA: Diagnosis not present

## 2022-05-09 DIAGNOSIS — Z471 Aftercare following joint replacement surgery: Secondary | ICD-10-CM | POA: Diagnosis not present

## 2022-05-09 DIAGNOSIS — D649 Anemia, unspecified: Secondary | ICD-10-CM | POA: Diagnosis not present

## 2022-05-09 DIAGNOSIS — N183 Chronic kidney disease, stage 3 unspecified: Secondary | ICD-10-CM | POA: Diagnosis not present

## 2022-05-09 DIAGNOSIS — E079 Disorder of thyroid, unspecified: Secondary | ICD-10-CM | POA: Diagnosis not present

## 2022-05-09 DIAGNOSIS — S72141D Displaced intertrochanteric fracture of right femur, subsequent encounter for closed fracture with routine healing: Secondary | ICD-10-CM | POA: Diagnosis not present

## 2022-05-09 DIAGNOSIS — S72001S Fracture of unspecified part of neck of right femur, sequela: Secondary | ICD-10-CM | POA: Diagnosis not present

## 2022-05-09 DIAGNOSIS — I129 Hypertensive chronic kidney disease with stage 1 through stage 4 chronic kidney disease, or unspecified chronic kidney disease: Secondary | ICD-10-CM | POA: Diagnosis not present

## 2022-05-09 DIAGNOSIS — I10 Essential (primary) hypertension: Secondary | ICD-10-CM | POA: Diagnosis not present

## 2022-05-09 DIAGNOSIS — Z9181 History of falling: Secondary | ICD-10-CM | POA: Diagnosis not present

## 2022-05-09 DIAGNOSIS — I951 Orthostatic hypotension: Secondary | ICD-10-CM | POA: Diagnosis not present

## 2022-05-09 DIAGNOSIS — Z741 Need for assistance with personal care: Secondary | ICD-10-CM | POA: Diagnosis not present

## 2022-05-09 DIAGNOSIS — N1831 Chronic kidney disease, stage 3a: Secondary | ICD-10-CM | POA: Diagnosis not present

## 2022-05-09 DIAGNOSIS — S72001A Fracture of unspecified part of neck of right femur, initial encounter for closed fracture: Secondary | ICD-10-CM | POA: Diagnosis not present

## 2022-05-09 DIAGNOSIS — Z961 Presence of intraocular lens: Secondary | ICD-10-CM | POA: Diagnosis not present

## 2022-05-09 DIAGNOSIS — R262 Difficulty in walking, not elsewhere classified: Secondary | ICD-10-CM | POA: Diagnosis not present

## 2022-05-09 DIAGNOSIS — E039 Hypothyroidism, unspecified: Secondary | ICD-10-CM | POA: Diagnosis not present

## 2022-05-09 NOTE — Progress Notes (Signed)
Patient seen and examined.  No overnight events.  Pain is controlled.  Better participating with physical therapy.  Looking forward to a SNF but unable to leave yesterday.  Case discussed with insurance physician, approved for short-term rehab at a SNF. Discharge reconciliation, discharge summary reviewed.  No changes needed. Stable for discharge today.  General: Looks comfortable.  On room air. Cardiovascular: S1 and S2 normal.  Regular rate rhythm. Respiratory: Bilateral clear. Gastrointestinal: Soft.  Nontender.  Bowel sound present. Ext: No swelling or edema. Right lateral thigh incision clean and dry.  Dressing intact.   Total time spent in coordination of care: 35 minutes.

## 2022-05-09 NOTE — Progress Notes (Signed)
Report called to Brett Canales, LPN at [facility].

## 2022-05-09 NOTE — Anesthesia Postprocedure Evaluation (Signed)
Anesthesia Post Note  Patient: Olivia Ramsey  Procedure(s) Performed: CEMENTED TOTAL HIP ARTHROPLASTY (Right: Hip)     Patient location during evaluation: PACU Anesthesia Type: MAC and Spinal Level of consciousness: awake Pain management: pain level controlled Vital Signs Assessment: post-procedure vital signs reviewed and stable Respiratory status: spontaneous breathing, nonlabored ventilation, respiratory function stable and patient connected to nasal cannula oxygen Cardiovascular status: stable and blood pressure returned to baseline Postop Assessment: no apparent nausea or vomiting Anesthetic complications: no   No notable events documented.  Last Vitals:  Vitals:   05/09/22 0232 05/09/22 0753  BP: (!) 164/51 (!) 160/56  Pulse: 82 91  Resp:  16  Temp:  36.7 C  SpO2:  98%    Last Pain:  Vitals:   05/09/22 0753  TempSrc: Oral  PainSc: 0-No pain                 Egbert Seidel

## 2022-05-09 NOTE — Progress Notes (Signed)
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  She states she tripped over the boxes landing to her right side; R hip fx, s/p R THA on 8/19, WBAT, and no formal hip precautions;  has a past medical history of GERD (gastroesophageal reflux disease), Hypertension, Hypothyroidism, and Thyroid disease.    PT Comments    Pt received in chair, agreeable to therapy session although c/o pain and nausea with standing. Orthostatics checked and pt with 30 point drop in SBP from sitting to standing, BP 162/59 (89) sitting and BP 132/53 (72) standing with standing HR 101 bpm. Pt able to perform seated and standing exercises and multiple transfers for strengthening but defer gait trial in room/hallway due to pt positive symptoms of orthostatic hypotension this date. Pt with ice pack for hip pain and RN notified she is requesting pain meds at end of session. Pt continues to benefit from PT services to progress toward functional mobility goals.    Recommendations for follow up therapy are one component of a multi-disciplinary discharge planning process, led by the attending physician.  Recommendations may be updated based on patient status, additional functional criteria and insurance authorization.  Follow Up Recommendations  Skilled nursing-short term rehab (<3 hours/day) Can patient physically be transported by private vehicle: Yes   Assistance Recommended at Discharge Set up Supervision/Assistance  Patient can return home with the following A lot of help with walking and/or transfers;Assistance with cooking/housework;Assist for transportation;Help with stairs or ramp for entrance   Equipment Recommendations  Rolling walker (2 wheels);BSC/3in1 (hx positive  orthostatics so 3in1 for nightime toileting or if symptoms of soft BP)    Recommendations for Other Services       Precautions / Restrictions Precautions Precautions: Fall Precaution Comments: symptomatic orthostatic hypotension Required Braces or Orthoses:  (Bilat thigh-high TED hose) Restrictions Weight Bearing Restrictions: Yes RLE Weight Bearing: Weight bearing as tolerated     Mobility  Bed Mobility               General bed mobility comments: received in chair    Transfers Overall transfer level: Needs assistance Equipment used: Rolling walker (2 wheels) Transfers: Sit to/from Stand Sit to Stand: Min assist, From elevated surface           General transfer comment: min lift assist with verbal cues for safety and hand placement x7 total reps    Ambulation/Gait Ambulation/Gait assistance: Min assist   Assistive device: Rolling walker (2 wheels)       Pre-gait activities: marching x10 reps ea, dizziness/nausea and c/o R hip/LE pain limiting and drop in BP so defer gait away from chair        Balance Overall balance assessment: Needs assistance Sitting-balance support: Feet supported Sitting balance-Leahy Scale: Fair     Standing balance support: Single extremity supported, Bilateral upper extremity supported, During functional activity Standing balance-Leahy Scale: Poor Standing balance comment: static standing with U UE support during BP assessment, no LOB; bilateral UE support for dynamic tasks                            Cognition Arousal/Alertness: Awake/alert Behavior During Therapy: WFL for tasks assessed/performed Overall Cognitive Status: Within Functional Limits for tasks assessed  General Comments: oriented x4        Exercises Total Joint Exercises Ankle Circles/Pumps: AROM, Both, 10 reps, Seated Long Arc Quad: AROM, Right, 10 reps, Seated Marching in Standing: AROM, Both,  10 reps, Standing    General Comments General comments (skin integrity, edema, etc.): BP 162/59 sitting; BP 132/53 standing, dizzy/nausea; HR 89 resting to 101 bpm standing      Pertinent Vitals/Pain Pain Assessment Pain Assessment: 0-10 Pain Score: 4  Faces Pain Scale: Hurts little more Pain Location: R hip with standing Pain Descriptors / Indicators: Discomfort, Sore, Grimacing Pain Intervention(s): Monitored during session, Repositioned, Patient requesting pain meds-RN notified, Ice applied           PT Goals (current goals can now be found in the care plan section) Acute Rehab PT Goals Patient Stated Goal: Back to independence PT Goal Formulation: With patient Time For Goal Achievement: 05/13/22 Progress towards PT goals: Progressing toward goals    Frequency    Min 6X/week      PT Plan Current plan remains appropriate       AM-PAC PT "6 Clicks" Mobility   Outcome Measure  Help needed turning from your back to your side while in a flat bed without using bedrails?: A Little Help needed moving from lying on your back to sitting on the side of a flat bed without using bedrails?: A Lot (without rails) Help needed moving to and from a bed to a chair (including a wheelchair)?: A Little Help needed standing up from a chair using your arms (e.g., wheelchair or bedside chair)?: A Little Help needed to walk in hospital room?: Total (due to symptoms today anticipate +2 chair follow needed; not able to perform during session due to BP) Help needed climbing 3-5 steps with a railing? : Total 6 Click Score: 13    End of Session Equipment Utilized During Treatment: Gait belt Activity Tolerance: Patient limited by pain;Treatment limited secondary to medical complications (Comment);Other (comment) (c/o nausea/lightheadedness with standing and SBP drop >30 points) Patient left: in chair;with call bell/phone within reach;with family/visitor present Nurse Communication: Mobility  status;Patient requests pain meds PT Visit Diagnosis: Unsteadiness on feet (R26.81);Other abnormalities of gait and mobility (R26.89)     Time: 1829-9371 PT Time Calculation (min) (ACUTE ONLY): 27 min  Charges:  $Therapeutic Exercise: 8-22 mins $Therapeutic Activity: 8-22 mins                     Jaelah Hauth P., PTA Acute Rehabilitation Services Secure Chat Preferred 9a-5:30pm Office: [phone]    Dorathy Kinsman [facility] 05/09/2022, 11:26 AM

## 2022-05-09 NOTE — Care Management Note (Addendum)
Calling [employer] for peer to peer review,  Waiting for Dr. Lanell Matar, I am waiting for call back on my cell phone number I have provided. Dr Jarvis Newcomer was unable to get a call back as per documentation yesterday.   Patient approved to go to snf as my discussion with Dr Lanell Matar.  Stable for discharge  Summary reviewed and no changed needed . Progress note to follow

## 2022-05-09 NOTE — TOC Transition Note (Signed)
Transition of Care [facility]) - CM/SW Discharge Note   Patient Details  Name: Olivia Ramsey MRN: [id number] Date of Birth: [birth year]  Transition of Care [facility]) CM/SW Contact:  Janae Bridgeman, RN Phone Number: 05/09/2022, 11:12 AM   Clinical Narrative:    CM spoke with Dr. Jerral Ralph this morning and the patient's insurance company has approved her for placement for SNF at [facility].  The patient was made aware.  I called and spoke with Jeri Lager, CM at the facility and she has bed availability for the patient this morning.  Discharge transfer report and discharge summary were both uploaded int he hub to the facility.    Discharge packet will be sent with the patient today and packet to include updated discharge summary, facesheet, DNR, and signed prescription for oxycodone as ordered.  The patient requests that her daughter provide transportation to the facility by car today when she arrives.  Bedside nursing - please call nursing report to [facility] at [phone].  CM will continue to follow the patient for discharge to [facility] this morning by private vehicle.   Final next level of care: Skilled Nursing Facility Barriers to Discharge: Continued Medical Work up   Patient Goals and CMS Choice Patient states their goals for this hospitalization and ongoing recovery are:: Patient wants SNF rehab CMS Medicare.gov Compare Post Acute Care list provided to:: Patient Choice offered to / list presented to : Patient  Discharge Placement                       Discharge Plan and Services   Discharge Planning Services: CM Consult Post Acute Care Choice: Skilled Nursing Facility                               Social Determinants of Health (SDOH) Interventions     Readmission Risk Interventions    05/07/2022    1:32 PM  Readmission Risk Prevention Plan  Post Dischage Appt Complete  Medication Screening Complete  Transportation  Screening Complete

## 2022-05-09 NOTE — Progress Notes (Signed)
Mobility Specialist Progress Note:   05/09/22 1018  Mobility  Activity Ambulated with assistance in room;Transferred to/from [facility]  Level of Assistance Contact guard assist, steadying assist  Assistive Device Front wheel walker  RLE Weight Bearing WBAT  Distance Ambulated (ft) 5 ft  Activity Response Tolerated fair  $Mobility charge 1 Mobility   Pt received in chair and requesting transfer to [facility]. C/o of stiffness and soreness in LE. Pt left on [facility] with call bell in reach and all needs met. Will f/u as needed.   Emre Stock Mobility Specialist-Acute Rehab Secure Chat only

## 2022-05-10 ENCOUNTER — Encounter: Payer: Self-pay | Admitting: Adult Health

## 2022-05-10 ENCOUNTER — Non-Acute Institutional Stay (SKILLED_NURSING_FACILITY): Payer: Medicare HMO | Admitting: Adult Health

## 2022-05-10 DIAGNOSIS — I129 Hypertensive chronic kidney disease with stage 1 through stage 4 chronic kidney disease, or unspecified chronic kidney disease: Secondary | ICD-10-CM

## 2022-05-10 DIAGNOSIS — K5909 Other constipation: Secondary | ICD-10-CM | POA: Insufficient documentation

## 2022-05-10 DIAGNOSIS — D62 Acute posthemorrhagic anemia: Secondary | ICD-10-CM | POA: Diagnosis not present

## 2022-05-10 DIAGNOSIS — N1831 Chronic kidney disease, stage 3a: Secondary | ICD-10-CM | POA: Diagnosis not present

## 2022-05-10 DIAGNOSIS — N183 Hypertensive chronic kidney disease with stage 1 through stage 4 chronic kidney disease, or unspecified chronic kidney disease: Secondary | ICD-10-CM | POA: Insufficient documentation

## 2022-05-10 DIAGNOSIS — S72001S Fracture of unspecified part of neck of right femur, sequela: Secondary | ICD-10-CM | POA: Diagnosis not present

## 2022-05-10 DIAGNOSIS — E039 Hypothyroidism, unspecified: Secondary | ICD-10-CM | POA: Insufficient documentation

## 2022-05-10 DIAGNOSIS — D696 Thrombocytopenia, unspecified: Secondary | ICD-10-CM

## 2022-05-10 NOTE — Progress Notes (Signed)
Location:  [facility] Room Number: 128P Place of Service:  SNF (31)   CODE STATUS: DNR  No Known Allergies  Chief Complaint  Patient presents with   Hospitalization Follow-up    Hospital Follow up.     HPI:  She is a 88 year old woman who has been hospitalized from 05-03-22 through 05-09-22. Her medical history includes; hypertension; hypothyroidism. She presented on on 05-03-22 after a mechanical fall at home. She was found to have a right hip fracture. She had a right hip arthroplasty on 05-05-22. She is weight bearing as tolerated; with no formal hip precautions. She had expected acute blood loss anemia; without needing a transfusion. Her hgb is stable at 8.5. she is here for short term rehab with her goal to return back home. She denies any right hip pain stating that she feels sore. She is sitting up in wheelchair fully dressed. She will continue to be followed for her chronic illnesses including:  Chronic kidney disease stage 3a:  Acute blood loss anemia:  Hypothyroidism (acquired);  Chronic constipation:    Past Medical History:  Diagnosis Date   GERD (gastroesophageal reflux disease)    Hypertension    Hypothyroidism    Thyroid disease     Past Surgical History:  Procedure Laterality Date   BREAST SURGERY Right    cyst   CATARACT EXTRACTION W/PHACO Left 02/29/2020   Procedure: CATARACT EXTRACTION PHACO AND INTRAOCULAR LENS PLACEMENT (IOC);  Surgeon: [name], MD;  Location: [facility];  Service: Ophthalmology;  Laterality: Left;  CDE: 10.16   CATARACT EXTRACTION W/PHACO Right 03/14/2020   Procedure: CATARACT EXTRACTION PHACO AND INTRAOCULAR LENS PLACEMENT RIGHT EYE;  Surgeon: [name], MD;  Location: [facility];  Service: Ophthalmology;  Laterality: Right;  CDE: 8.59   TOTAL HIP ARTHROPLASTY Right 05/05/2022   Procedure: CEMENTED TOTAL HIP ARTHROPLASTY;  Surgeon: Joen Laura, MD;  Location: [facility];  Service: Orthopedics;  Laterality: Right;     Social History   Socioeconomic History   Marital status: Widowed    Spouse name: Not on file   Number of children: Not on file   Years of education: Not on file   Highest education level: Not on file  Occupational History   Not on file  Tobacco Use   Smoking status: Never   Smokeless tobacco: Never  Substance and Sexual Activity   Alcohol use: No   Drug use: No   Sexual activity: Not on file  Other Topics Concern   Not on file  Social History Narrative   Not on file   Social Determinants of Health   Financial Resource Strain: Not on file  Food Insecurity: Not on file  Transportation Needs: Not on file  Physical Activity: Not on file  Stress: Not on file  Social Connections: Not on file  Intimate Partner Violence: Not on file   Family History  Problem Relation Age of Onset   Stroke Father       VITAL SIGNS BP (!) 180/60   Pulse 88   Temp 98.6 F (37 C) (Skin)   Resp 20   Ht 5\' 6"  (1.676 m)   Wt 131 lb 3.2 oz (59.5 kg)   SpO2 98%   BMI 21.18 kg/m   Outpatient Encounter Medications as of 05/10/2022  Medication Sig   acetaminophen (TYLENOL) 650 MG CR tablet Take 650 mg by mouth 3 (three) times daily.   aspirin EC 81 MG tablet Take 1 tablet (81  mg total) by mouth 2 (two) times daily. Swallow whole.   cholecalciferol (VITAMIN D3) 25 MCG (1000 UNIT) tablet Take 1,000 Units by mouth daily.   ferrous sulfate 325 (65 FE) MG tablet Take 1 tablet (325 mg total) by mouth daily.   LACTOBACILLUS PO Take One Capsule (20 Billion cell) by mouth, swallow whole or Sprinkle, Twice daily.   levothyroxine (SYNTHROID) 50 MCG tablet Take 50 mcg by mouth daily before breakfast.   losartan (COZAAR) 100 MG tablet Take 0.5 tablets (50 mg total) by mouth daily.   oxyCODONE (OXY IR/ROXICODONE) 5 MG immediate release tablet Take 0.5-1 tablets (2.5-5 mg total) by mouth every 6 (six) hours as needed for up to 7 days for moderate pain or severe pain. (Patient taking differently: Take  2.5 mg by mouth every 6 (six) hours as needed for moderate pain or severe pain.)   senna (SENOKOT) 8.6 MG TABS tablet Take 1 tablet (8.6 mg total) by mouth 2 (two) times daily.   [DISCONTINUED] acidophilus (RISAQUAD) CAPS capsule Take 1 capsule by mouth daily.   No facility-administered encounter medications on file as of 05/10/2022.     SIGNIFICANT DIAGNOSTIC EXAMS  TODAY  05-03-22: pelvic x-ray: Intertrochanteric fracture of proximal right femur.   05-03-22: ct of right hip: Intertrochanteric right femur fracture   LABS REVIEWED;   05-03-22: wbc 10.1; hgb 11.2; hct 33.4; mcv 91.8 plt 150; glucose 160; bun 24; creat 1.15; k+ 3.9; na++ 134; ca 8.9; gfr 46 05-05-22: wbc 7.3; hgb 10.7; hct 30.8; mcv 89.8 plt 144; glucose 122; bun 15; creat 1.04; k+ 3.9; na++ 132; ca 8.7; gfr 51 05-06-22: vitamin D 43.38 05-08-22: wbc 7.4; hgb 8.5; hct 23.6; mcv 87.7 plt 153  Review of Systems  Constitutional:  Negative for malaise/fatigue.  Respiratory:  Negative for cough and shortness of breath.   Cardiovascular:  Negative for chest pain, palpitations and leg swelling.  Gastrointestinal:  Negative for abdominal pain, constipation and heartburn.  Musculoskeletal:  Negative for back pain, joint pain and myalgias.       Right hip is sore   Skin: Negative.   Neurological:  Negative for dizziness.  Psychiatric/Behavioral:  The patient is not nervous/anxious.    Physical Exam Constitutional:      General: She is not in acute distress.    Appearance: She is well-developed. She is not diaphoretic.  Neck:     Thyroid: No thyromegaly.  Cardiovascular:     Rate and Rhythm: Normal rate and regular rhythm.     Pulses: Normal pulses.     Heart sounds: Normal heart sounds.  Pulmonary:     Effort: Pulmonary effort is normal. No respiratory distress.     Breath sounds: Normal breath sounds.  Abdominal:     General: Bowel sounds are normal. There is no distension.     Palpations: Abdomen is soft.      Tenderness: There is no abdominal tenderness.  Musculoskeletal:     Cervical back: Neck supple.     Right lower leg: No edema.     Left lower leg: No edema.     Comments: 05-05-22: right hip arthroplasty Is able to move all extremities   Lymphadenopathy:     Cervical: No cervical adenopathy.  Skin:    General: Skin is warm and dry.     Comments: Incision line without signs of infection present  Bruising to left upper arm and right hip area   Neurological:     Mental Status: She is alert  and oriented to person, place, and time.  Psychiatric:        Mood and Affect: Mood normal.       ASSESSMENT/ PLAN:  TODAY  Closed fracture right hip sequela: will continue therapy as directed to improve upon her level of independence with her adls. Will follow up with orthopedics. She does not take narcotics; will stop oxycodone. Will continue tylenol 650 mg three times daily; asa 81 mg twice daily   2. Chronic kidney disease stage 3a: bun 15; creat 1.04; gfr 51  3. Acute blood loss anemia: hgb 8.5; will continue to iron daily and will monitor her hgb/hct   4. Hypothyroidism (acquired); will continue synthroid 50 mcg daily   5. Chronic constipation: will continue senna s twice daily   6. Thrombocytopenia: plt 153; has resolved; more than likely an inflammatory response  7. Benign hypertension with chronic kidney disease stage III: b/p 180/60: her cozaar was lowered from 100 mg to 50 mg daily; will continue to monitor her status; will re-assess on 05-14-22.    Synthia Innocent NP [facility]  call [phone]

## 2022-05-11 ENCOUNTER — Other Ambulatory Visit: Payer: Self-pay | Admitting: *Deleted

## 2022-05-11 ENCOUNTER — Encounter: Payer: Self-pay | Admitting: Internal Medicine

## 2022-05-11 ENCOUNTER — Non-Acute Institutional Stay (SKILLED_NURSING_FACILITY): Payer: Medicare HMO | Admitting: Internal Medicine

## 2022-05-11 DIAGNOSIS — I10 Essential (primary) hypertension: Secondary | ICD-10-CM

## 2022-05-11 DIAGNOSIS — S72001S Fracture of unspecified part of neck of right femur, sequela: Secondary | ICD-10-CM | POA: Diagnosis not present

## 2022-05-11 DIAGNOSIS — D62 Acute posthemorrhagic anemia: Secondary | ICD-10-CM

## 2022-05-11 DIAGNOSIS — D696 Thrombocytopenia, unspecified: Secondary | ICD-10-CM | POA: Diagnosis not present

## 2022-05-11 DIAGNOSIS — N1831 Chronic kidney disease, stage 3a: Secondary | ICD-10-CM | POA: Diagnosis not present

## 2022-05-11 DIAGNOSIS — E039 Hypothyroidism, unspecified: Secondary | ICD-10-CM

## 2022-05-11 DIAGNOSIS — I951 Orthostatic hypotension: Secondary | ICD-10-CM

## 2022-05-11 NOTE — Assessment & Plan Note (Signed)
Transient thrombocytopenia with a platelet count of 128,000 post THA 05/05/2022.  TCP resolved with final platelet count 153,000.

## 2022-05-11 NOTE — Assessment & Plan Note (Addendum)
8/17 - 05/09/2022 AKI with creatinine of 1.15 and GFR 46 indicating CKD low stage IIIa.  Final creatinine 1.03 and GFR 52 indicating CKD stage IIIa. No indication for medication adjustment unless CKD progresses.

## 2022-05-11 NOTE — Assessment & Plan Note (Addendum)
Eyebrows are essentially absent.  No abnormality of thyroid is present on palpation.  Deep tendon reflexes are normal.  There is no current TSH in the chart.  As she is clinically euthyroid recheck of the TSH is deferred to her PCP.

## 2022-05-11 NOTE — Patient Instructions (Signed)
See assessment and plan under each diagnosis in the problem list and acutely for this visit 

## 2022-05-11 NOTE — Assessment & Plan Note (Signed)
Post THA 05/05/2022 she did have nadir H/H of 8.1/23.3.  Oral iron supplement was ordered; no transfusion administered.  At discharge H/H 8.5/23.6 with normochromic, normocytic indices.  No bleeding dyscrasias reported at the SNF on low-dose aspirin twice daily DVT prophylaxis.

## 2022-05-11 NOTE — Assessment & Plan Note (Addendum)
Repeat the isometric exercises discussed 4- 5 times prior to standing if having been supine or seated for a period of time. I recommended she consider purchasing a Smart Watch to help monitor for dysrhythmias.  Also asked her to discuss possible carotid Dopplers with Dr Sherwood Gambler due to the presence of the carotid bruits bilaterally.

## 2022-05-11 NOTE — Assessment & Plan Note (Signed)
PT/OT at SNF as tolerated. ?

## 2022-05-11 NOTE — Patient Outreach (Signed)
Per [facility] Olivia Ramsey admitted to [facility] on 05/09/22. Screening for potential [facility] care coordination as a benefit of insurance plan and PCP.   Telephone call made to Olivia Ramsey [phone] to discuss [facility] services. Olivia Ramsey placed her daughter Olivia Ramsey on the phone. Olivia Ramsey confirms Olivia Ramsey is from home alone and was independent prior. Discussed [facility] care coordination services with Olivia Ramsey. Obtained email address to email [facility] Care Management brochure, [facility] website address, and writer's contact information to call if services are warranted.   Will send notification to SNF SW to make aware writer following for potential [facility] care coordination services.   Raiford Noble, MSN, RN,BSN [facility] Post Acute Care Coordinator [phone] [facility]) [phone]  (Toll free office)

## 2022-05-11 NOTE — Assessment & Plan Note (Signed)
Losartan was decreased to 50 mg while hospitalized for the THA because of postural hypotension.  If the systolic blood pressure is significantly elevated on a consistent basis; losartan dose will be titrated up.

## 2022-05-11 NOTE — Progress Notes (Signed)
NURSING HOME LOCATION:  [facility] ROOM NUMBER:  128 P  CODE STATUS:  DNR  PCP:  Elfredia Nevins MD  This is a comprehensive admission note to this SNFperformed on this date less than 30 days from date of admission. Included are preadmission medical/surgical history; reconciled medication list; family history; social history and comprehensive review of systems.  Corrections and additions to the records were documented. Comprehensive physical exam was also performed. Additionally a clinical summary was entered for each active diagnosis pertinent to this admission in the Problem List to enhance continuity of care.  HPI: She was hospitalized 8/17 - 05/09/2022 having sustained a closed right hip fracture in a mechanical fall.  She states that she was attempting to pull a long thin box when she tripped up some brick steps.  Although she has a remote history of dysrhythmia and intermittent exertional "heart fluttering" ; she denied any cardiac or neurologic prodrome prior to this fall.   THA was performed 8/19 by Dr. Blanchie Dessert.  She did have expected blood loss anemia postop.  Preop H/H was 11.2/33.4.  Nadir H/H was 8.1/23.3.  She also had slight thrombocytopenia with a platelet count of 128,000.  No transfusion was administered.  At discharge H/H was 8.5/23.6 with normochromic, normocytic indices.  Platelet count normalized at 153,000.  During hospitalization glucoses ranged from 122 up to 160.  Hyponatremia was present with a nadir sodium of 130. Slight AKI was present with a creatinine of 1.15 and GFR 46 indicating low IIIa CKD.  At discharge creatinine was 1.03 and GFR 52 indicating CKD stage IIIa. Course was complicated by postural hypotension for which TED hose were prescribed and losartan was decreased from 100 to 50 mg daily. DVT prophylaxis was with aspirin 81 mg twice daily. PT/OT recommended rehab although by history the patient is quite physically active and independent at  baseline.  Past medical and surgical history: GERD, essential hypertension, hx of liver laceration , and hypothyroidism. Surgeries and procedures include excision of a right breast cyst and bilateral cataract extraction.  Social history: Nondrinker; non-smoker.  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  Family history: History reviewed; it is noncontributory due to advanced age.   Review of systems: She is alert and oriented and an excellent historian.  She is a most delightful individual.  She stated that she walks extensively and "my daughters have difficulty keeping up with me." She states that when she flexes at the waist she will intermittently have dizziness without frank vertigo.  She states that this seems to be somewhat of a family trait as both daughters and her mother demonstrated this phenomena. She also describes intermittent dizziness upon sitting up in bed.  Apparently she did have an extensive cardiac evaluation for "heart beating out of rhythm" in her late 19s. She describes occasional restless leg syndrome at night. She has somewhat of a chronic poor appetite and drinks a protein supplement at home.  She states that the pain medications tend to cause gas.  She is anxious to avoid narcotics if possible.  Constitutional: No fever, significant weight change, fatigue  Eyes: No redness, discharge, pain, vision change ENT/mouth: No nasal congestion, purulent discharge, earache, change in hearing, sore throat  Cardiovascular: No chest pain, paroxysmal nocturnal dyspnea, claudication, edema  Respiratory: No cough, sputum production, hemoptysis, DOE, significant snoring, apnea  Gastrointestinal: No heartburn, dysphagia, abdominal pain, nausea /vomiting, rectal bleeding, melena Genitourinary: No dysuria, hematuria, pyuria, incontinence, nocturia Musculoskeletal: No joint stiffness, joint swelling, weakness Dermatologic:  No rash, pruritus, change in appearance of skin Neurologic: No headache,  syncope, seizures, numbness, tingling Psychiatric: No significant anxiety, depression, insomnia Endocrine: No change in hair/skin/nails, excessive thirst, excessive hunger, excessive urination  Hematologic/lymphatic: No significant bruising, lymphadenopathy, abnormal bleeding Allergy/immunology: No itchy/watery eyes, significant sneezing, urticaria, angioedema  Physical exam:  Pertinent or positive findings: She is very interactive.  Eyebrows are essentially absent. She wears hearing aids.  Grade 1 systolic murmur is present at the base and lower left sternal border.  Left carotid bruit is louder than the right.  Dorsalis pedis pulses are stronger than posterior tibial pulses.  There is suggestion of possible slight clubbing of the nailbeds.  Limb atrophy is present as well as interosseous wasting of the hands.  Strength to opposition is fair in the upper extremities and good in the lower extremities.  General appearance: no acute distress, increased work of breathing is present.   Lymphatic: No lymphadenopathy about the head, neck, axilla. Eyes: No conjunctival inflammation or lid edema is present. There is no scleral icterus. Ears:  External ear exam shows no significant lesions or deformities.   Nose:  External nasal examination shows no deformity or inflammation. Nasal mucosa are pink and moist without lesions, exudates Oral exam: Lips and gums are healthy appearing.There is no oropharyngeal erythema or exudate. Neck:  No thyromegaly, masses, tenderness noted.    Heart:  Normal rate and regular rhythm. S1 and S2 normal without gallop, click, rub.  Lungs: Chest clear to auscultation without wheezes, rhonchi, rales, rubs. Abdomen: Bowel sounds are normal.  Abdomen is soft and nontender with no organomegaly, hernias, masses. GU: Deferred  Extremities:  No cyanosis, edema. Neurologic exam:Balance, Rhomberg, finger to nose testing could not be completed due to clinical state Deep tendon reflexes  are equal Skin: Warm & dry w/o tenting. No significant lesions or rash.  See clinical summary under each active problem in the Problem List with associated updated therapeutic plan

## 2022-05-14 ENCOUNTER — Encounter: Payer: Self-pay | Admitting: Adult Health

## 2022-05-14 ENCOUNTER — Non-Acute Institutional Stay (SKILLED_NURSING_FACILITY): Payer: Medicare HMO | Admitting: Adult Health

## 2022-05-14 DIAGNOSIS — N183 Chronic kidney disease, stage 3 unspecified: Secondary | ICD-10-CM | POA: Diagnosis not present

## 2022-05-14 DIAGNOSIS — I129 Hypertensive chronic kidney disease with stage 1 through stage 4 chronic kidney disease, or unspecified chronic kidney disease: Secondary | ICD-10-CM | POA: Diagnosis not present

## 2022-05-14 LAB — GLUCOSE, CAPILLARY: Glucose-Capillary: 104 mg/dL — ABNORMAL HIGH (ref 70–99)

## 2022-05-14 NOTE — Progress Notes (Unsigned)
Location:  [facility] Room Number: 128 Place of Service:  SNF (31)   CODE STATUS: dnr   No Known Allergies  Chief Complaint  Patient presents with   Hypertension    HPI:  Her blood pressure medications were adjusted while she was in the hospital. Her initial blood pressure readings were in the 180's. Since that time her readings have stabilized out to the 120's. She denies headaches blurring of vision.   Past Medical History:  Diagnosis Date   GERD (gastroesophageal reflux disease)    Hypertension    Hypothyroidism    Thyroid disease     Past Surgical History:  Procedure Laterality Date   BREAST SURGERY Right    cyst   CATARACT EXTRACTION W/PHACO Left 02/29/2020   Procedure: CATARACT EXTRACTION PHACO AND INTRAOCULAR LENS PLACEMENT (IOC);  Surgeon: [name], MD;  Location: [facility];  Service: Ophthalmology;  Laterality: Left;  CDE: 10.16   CATARACT EXTRACTION W/PHACO Right 03/14/2020   Procedure: CATARACT EXTRACTION PHACO AND INTRAOCULAR LENS PLACEMENT RIGHT EYE;  Surgeon: [name], MD;  Location: [facility];  Service: Ophthalmology;  Laterality: Right;  CDE: 8.59   TOTAL HIP ARTHROPLASTY Right 05/05/2022   Procedure: CEMENTED TOTAL HIP ARTHROPLASTY;  Surgeon: Joen Laura, MD;  Location: [facility];  Service: Orthopedics;  Laterality: Right;    Social History   Socioeconomic History   Marital status: Widowed    Spouse name: Not on file   Number of children: Not on file   Years of education: Not on file   Highest education level: Not on file  Occupational History   Not on file  Tobacco Use   Smoking status: Never   Smokeless tobacco: Never  Substance and Sexual Activity   Alcohol use: No   Drug use: No   Sexual activity: Not on file  Other Topics Concern   Not on file  Social History Narrative   Not on file   Social Determinants of Health   Financial Resource Strain: Not on file  Food Insecurity: Not on file   Transportation Needs: Not on file  Physical Activity: Not on file  Stress: Not on file  Social Connections: Not on file  Intimate Partner Violence: Not on file   Family History  Problem Relation Age of Onset   Stroke Father       VITAL SIGNS BP 127/73   Pulse 71   Temp 97.9 F (36.6 C)   Resp 20   Ht 5\' 6"  (1.676 m)   Wt 131 lb 4.8 oz (59.6 kg)   SpO2 95%   BMI 21.19 kg/m   Outpatient Encounter Medications as of 05/14/2022  Medication Sig   acetaminophen (TYLENOL) 650 MG CR tablet Take 650 mg by mouth 3 (three) times daily.   aspirin EC 81 MG tablet Take 1 tablet (81 mg total) by mouth 2 (two) times daily. Swallow whole.   cholecalciferol (VITAMIN D3) 25 MCG (1000 UNIT) tablet Take 1,000 Units by mouth daily.   ferrous sulfate 325 (65 FE) MG tablet Take 1 tablet (325 mg total) by mouth daily.   LACTOBACILLUS PO Take One Capsule (20 Billion cell) by mouth, swallow whole or Sprinkle, Twice daily.   levothyroxine (SYNTHROID) 50 MCG tablet Take 50 mcg by mouth daily before breakfast.   losartan (COZAAR) 100 MG tablet Take 0.5 tablets (50 mg total) by mouth daily.   senna (SENOKOT) 8.6 MG TABS tablet Take 1 tablet (8.6 mg total) by mouth  2 (two) times daily.   No facility-administered encounter medications on file as of 05/14/2022.     SIGNIFICANT DIAGNOSTIC EXAMS  PREVIOUS   05-03-22: pelvic x-ray: Intertrochanteric fracture of proximal right femur.   05-03-22: ct of right hip: Intertrochanteric right femur fracture   NO NEW LABS.   LABS REVIEWED; PREVIOUS   05-03-22: wbc 10.1; hgb 11.2; hct 33.4; mcv 91.8 plt 150; glucose 160; bun 24; creat 1.15; k+ 3.9; na++ 134; ca 8.9; gfr 46 05-05-22: wbc 7.3; hgb 10.7; hct 30.8; mcv 89.8 plt 144; glucose 122; bun 15; creat 1.04; k+ 3.9; na++ 132; ca 8.7; gfr 51 05-06-22: vitamin D 43.38 05-08-22: wbc 7.4; hgb 8.5; hct 23.6; mcv 87.7 plt 153  NO NEW LABS.   Review of Systems  Constitutional:  Negative for malaise/fatigue.   Respiratory:  Negative for cough and shortness of breath.   Cardiovascular:  Negative for chest pain, palpitations and leg swelling.  Gastrointestinal:  Negative for abdominal pain, constipation and heartburn.  Musculoskeletal:  Negative for back pain, joint pain and myalgias.  Skin: Negative.   Neurological:  Negative for dizziness.  Psychiatric/Behavioral:  The patient is not nervous/anxious.     Physical Exam Constitutional:      General: She is not in acute distress.    Appearance: She is well-developed. She is not diaphoretic.  Neck:     Thyroid: No thyromegaly.     Vascular: Carotid bruit present.  Cardiovascular:     Rate and Rhythm: Normal rate and regular rhythm.     Heart sounds: Murmur heard.  Pulmonary:     Effort: Pulmonary effort is normal. No respiratory distress.     Breath sounds: Normal breath sounds.  Abdominal:     General: Bowel sounds are normal. There is no distension.     Palpations: Abdomen is soft.     Tenderness: There is no abdominal tenderness.  Musculoskeletal:     Cervical back: Neck supple.     Right lower leg: No edema.     Left lower leg: No edema.     Comments:  05-05-22: right hip arthroplasty Is able to move all extremities   Lymphadenopathy:     Cervical: No cervical adenopathy.  Skin:    General: Skin is warm and dry.  Neurological:     Mental Status: She is alert and oriented to person, place, and time.  Psychiatric:        Mood and Affect: Mood normal.      ASSESSMENT/ PLAN:  TODAY   Benign hypertension associated with stage 2 chronic kidney disease: is presently stable will continue to monitor her status.   Synthia Innocent NP [facility]   call [phone]

## 2022-05-17 ENCOUNTER — Encounter (HOSPITAL_COMMUNITY)
Admission: RE | Admit: 2022-05-17 | Discharge: 2022-05-17 | Disposition: A | Discharge status: Home / Self Care | Payer: Medicare HMO | Source: Skilled Nursing Facility | Attending: Adult Health | Admitting: Adult Health

## 2022-05-17 DIAGNOSIS — D649 Anemia, unspecified: Secondary | ICD-10-CM | POA: Insufficient documentation

## 2022-05-17 LAB — HEMOGLOBIN AND HEMATOCRIT, BLOOD
HCT: 26.9 % — ABNORMAL LOW (ref 36.0–46.0)
Hemoglobin: 8.9 g/dL — ABNORMAL LOW (ref 12.0–15.0)

## 2022-05-18 ENCOUNTER — Non-Acute Institutional Stay (SKILLED_NURSING_FACILITY): Payer: Medicare HMO | Admitting: Adult Health

## 2022-05-18 ENCOUNTER — Encounter: Payer: Self-pay | Admitting: Adult Health

## 2022-05-18 ENCOUNTER — Other Ambulatory Visit: Payer: Self-pay | Admitting: Adult Health

## 2022-05-18 DIAGNOSIS — D696 Thrombocytopenia, unspecified: Secondary | ICD-10-CM

## 2022-05-18 DIAGNOSIS — N1831 Chronic kidney disease, stage 3a: Secondary | ICD-10-CM

## 2022-05-18 DIAGNOSIS — I129 Hypertensive chronic kidney disease with stage 1 through stage 4 chronic kidney disease, or unspecified chronic kidney disease: Secondary | ICD-10-CM | POA: Diagnosis not present

## 2022-05-18 DIAGNOSIS — N183 Chronic kidney disease, stage 3 unspecified: Secondary | ICD-10-CM

## 2022-05-18 DIAGNOSIS — S72001S Fracture of unspecified part of neck of right femur, sequela: Secondary | ICD-10-CM | POA: Diagnosis not present

## 2022-05-18 MED ORDER — FERROUS SULFATE 325 (65 FE) MG PO TABS
325.0000 mg | ORAL_TABLET | Freq: Every day | ORAL | 0 refills | Status: AC
Start: 2022-05-18 — End: 2024-04-13

## 2022-05-18 MED ORDER — LOSARTAN POTASSIUM 100 MG PO TABS: 50.0000 mg | ORAL_TABLET | Freq: Every day | ORAL | 0 refills | Status: AC

## 2022-05-18 MED ORDER — LEVOTHYROXINE SODIUM 50 MCG PO TABS
50.0000 ug | ORAL_TABLET | Freq: Every day | ORAL | 0 refills | Status: DC
Start: 2022-05-18 — End: 2024-04-13

## 2022-05-18 NOTE — Progress Notes (Unsigned)
Location:  [facility] Room Number: 128-P Place of Service:  SNF (31)   CODE STATUS: DNR  No Known Allergies  Chief Complaint  Patient presents with   Discharge Note    HPI:    Past Medical History:  Diagnosis Date   GERD (gastroesophageal reflux disease)    Hypertension    Hypothyroidism    Thyroid disease     Past Surgical History:  Procedure Laterality Date   BREAST SURGERY Right    cyst   CATARACT EXTRACTION W/PHACO Left 02/29/2020   Procedure: CATARACT EXTRACTION PHACO AND INTRAOCULAR LENS PLACEMENT (IOC);  Surgeon: [name], MD;  Location: [facility];  Service: Ophthalmology;  Laterality: Left;  CDE: 10.16   CATARACT EXTRACTION W/PHACO Right 03/14/2020   Procedure: CATARACT EXTRACTION PHACO AND INTRAOCULAR LENS PLACEMENT RIGHT EYE;  Surgeon: [name], MD;  Location: [facility];  Service: Ophthalmology;  Laterality: Right;  CDE: 8.59   TOTAL HIP ARTHROPLASTY Right 05/05/2022   Procedure: CEMENTED TOTAL HIP ARTHROPLASTY;  Surgeon: Joen Laura, MD;  Location: [facility] OR;  Service: Orthopedics;  Laterality: Right;    Social History   Socioeconomic History   Marital status: Widowed    Spouse name: Not on file   Number of children: Not on file   Years of education: Not on file   Highest education level: Not on file  Occupational History   Not on file  Tobacco Use   Smoking status: Never   Smokeless tobacco: Never  Substance and Sexual Activity   Alcohol use: No   Drug use: No   Sexual activity: Not on file  Other Topics Concern   Not on file  Social History Narrative   Not on file   Social Determinants of Health   Financial Resource Strain: Not on file  Food Insecurity: Not on file  Transportation Needs: Not on file  Physical Activity: Not on file  Stress: Not on file  Social Connections: Not on file  Intimate Partner Violence: Not on file   Family History  Problem Relation Age of Onset   Stroke Father        VITAL SIGNS BP (!) 160/60   Pulse 76   Temp 97.8 F (36.6 C)   Resp 20   Ht 5\' 6"  (1.676 m)   Wt 132 lb (59.9 kg)   SpO2 96%   BMI 21.31 kg/m   Outpatient Encounter Medications as of 05/18/2022  Medication Sig   acetaminophen (TYLENOL) 650 MG CR tablet Take 650 mg by mouth 3 (three) times daily.   aspirin EC 81 MG tablet Take 1 tablet (81 mg total) by mouth 2 (two) times daily. Swallow whole.   cholecalciferol (VITAMIN D3) 25 MCG (1000 UNIT) tablet Take 1,000 Units by mouth daily.   ferrous sulfate 325 (65 FE) MG tablet Take 1 tablet (325 mg total) by mouth daily.   LACTOBACILLUS PO Take One Capsule (20 Billion cell) by mouth, swallow whole or Sprinkle, Twice daily.   levothyroxine (SYNTHROID) 50 MCG tablet Take 50 mcg by mouth daily before breakfast.   losartan (COZAAR) 100 MG tablet Take 0.5 tablets (50 mg total) by mouth daily.   senna (SENOKOT) 8.6 MG TABS tablet Take 1 tablet (8.6 mg total) by mouth 2 (two) times daily.   No facility-administered encounter medications on file as of 05/18/2022.     SIGNIFICANT DIAGNOSTIC EXAMS       ASSESSMENT/ PLAN:     07/18/2022 NP [facility]  Contact [phone] Monday through Friday 8am- 5pm  After hours call [phone]

## 2022-05-22 DIAGNOSIS — S72001D Fracture of unspecified part of neck of right femur, subsequent encounter for closed fracture with routine healing: Secondary | ICD-10-CM | POA: Diagnosis not present

## 2022-05-24 DIAGNOSIS — N1831 Chronic kidney disease, stage 3a: Secondary | ICD-10-CM | POA: Diagnosis not present

## 2022-05-24 DIAGNOSIS — D631 Anemia in chronic kidney disease: Secondary | ICD-10-CM | POA: Diagnosis not present

## 2022-05-24 DIAGNOSIS — E039 Hypothyroidism, unspecified: Secondary | ICD-10-CM | POA: Diagnosis not present

## 2022-05-24 DIAGNOSIS — Z96641 Presence of right artificial hip joint: Secondary | ICD-10-CM | POA: Diagnosis not present

## 2022-05-24 DIAGNOSIS — M6281 Muscle weakness (generalized): Secondary | ICD-10-CM | POA: Diagnosis not present

## 2022-05-24 DIAGNOSIS — S72141D Displaced intertrochanteric fracture of right femur, subsequent encounter for closed fracture with routine healing: Secondary | ICD-10-CM | POA: Diagnosis not present

## 2022-05-24 DIAGNOSIS — K219 Gastro-esophageal reflux disease without esophagitis: Secondary | ICD-10-CM | POA: Diagnosis not present

## 2022-05-24 DIAGNOSIS — Z961 Presence of intraocular lens: Secondary | ICD-10-CM | POA: Diagnosis not present

## 2022-05-24 DIAGNOSIS — I129 Hypertensive chronic kidney disease with stage 1 through stage 4 chronic kidney disease, or unspecified chronic kidney disease: Secondary | ICD-10-CM | POA: Diagnosis not present

## 2022-05-24 DIAGNOSIS — Z9181 History of falling: Secondary | ICD-10-CM | POA: Diagnosis not present

## 2022-05-24 DIAGNOSIS — I951 Orthostatic hypotension: Secondary | ICD-10-CM | POA: Diagnosis not present

## 2022-05-25 ENCOUNTER — Other Ambulatory Visit (HOSPITAL_COMMUNITY): Payer: Self-pay | Admitting: Family Medicine

## 2022-05-25 ENCOUNTER — Other Ambulatory Visit: Payer: Self-pay | Admitting: Family Medicine

## 2022-05-25 ENCOUNTER — Other Ambulatory Visit: Payer: Self-pay | Admitting: *Deleted

## 2022-05-25 DIAGNOSIS — R0989 Other specified symptoms and signs involving the circulatory and respiratory systems: Secondary | ICD-10-CM

## 2022-05-25 DIAGNOSIS — Z96649 Presence of unspecified artificial hip joint: Secondary | ICD-10-CM | POA: Diagnosis not present

## 2022-05-25 DIAGNOSIS — E039 Hypothyroidism, unspecified: Secondary | ICD-10-CM | POA: Diagnosis not present

## 2022-05-25 DIAGNOSIS — I10 Essential (primary) hypertension: Secondary | ICD-10-CM | POA: Diagnosis not present

## 2022-05-25 DIAGNOSIS — Z681 Body mass index (BMI) 19 or less, adult: Secondary | ICD-10-CM | POA: Diagnosis not present

## 2022-05-25 NOTE — Patient Outreach (Signed)
[facility] Post- Acute Care Coordinator follow up.   Verified in [facility] Mrs. Madura discharged from [facility] on 05/19/22. There were no identifiable [facility] care coordination needs.   Raiford Noble, MSN, RN,BSN [facility] Post Acute Care Coordinator [phone] [facility]) [phone]  (Toll free office)

## 2022-06-05 DIAGNOSIS — S72141D Displaced intertrochanteric fracture of right femur, subsequent encounter for closed fracture with routine healing: Secondary | ICD-10-CM | POA: Diagnosis not present

## 2022-06-05 DIAGNOSIS — N1831 Chronic kidney disease, stage 3a: Secondary | ICD-10-CM | POA: Diagnosis not present

## 2022-06-05 DIAGNOSIS — Z96641 Presence of right artificial hip joint: Secondary | ICD-10-CM | POA: Diagnosis not present

## 2022-06-05 DIAGNOSIS — I129 Hypertensive chronic kidney disease with stage 1 through stage 4 chronic kidney disease, or unspecified chronic kidney disease: Secondary | ICD-10-CM | POA: Diagnosis not present

## 2022-06-08 DIAGNOSIS — D500 Iron deficiency anemia secondary to blood loss (chronic): Secondary | ICD-10-CM | POA: Diagnosis not present

## 2022-06-21 DIAGNOSIS — S72001D Fracture of unspecified part of neck of right femur, subsequent encounter for closed fracture with routine healing: Secondary | ICD-10-CM | POA: Diagnosis not present

## 2022-10-12 DIAGNOSIS — R350 Frequency of micturition: Secondary | ICD-10-CM | POA: Diagnosis not present

## 2022-10-12 DIAGNOSIS — N10 Acute tubulo-interstitial nephritis: Secondary | ICD-10-CM | POA: Diagnosis not present

## 2022-11-16 DIAGNOSIS — N10 Acute tubulo-interstitial nephritis: Secondary | ICD-10-CM | POA: Diagnosis not present

## 2022-11-16 DIAGNOSIS — R350 Frequency of micturition: Secondary | ICD-10-CM | POA: Diagnosis not present

## 2023-01-30 DIAGNOSIS — N760 Acute vaginitis: Secondary | ICD-10-CM | POA: Diagnosis not present

## 2023-01-30 DIAGNOSIS — N3000 Acute cystitis without hematuria: Secondary | ICD-10-CM | POA: Diagnosis not present

## 2023-02-24 DIAGNOSIS — N390 Urinary tract infection, site not specified: Secondary | ICD-10-CM | POA: Diagnosis not present

## 2023-02-24 DIAGNOSIS — Z681 Body mass index (BMI) 19 or less, adult: Secondary | ICD-10-CM | POA: Diagnosis not present

## 2023-03-13 DIAGNOSIS — N1831 Chronic kidney disease, stage 3a: Secondary | ICD-10-CM | POA: Diagnosis not present

## 2023-03-13 DIAGNOSIS — I10 Essential (primary) hypertension: Secondary | ICD-10-CM | POA: Diagnosis not present

## 2023-03-13 DIAGNOSIS — N390 Urinary tract infection, site not specified: Secondary | ICD-10-CM | POA: Diagnosis not present

## 2023-03-13 DIAGNOSIS — Z96641 Presence of right artificial hip joint: Secondary | ICD-10-CM | POA: Diagnosis not present

## 2023-03-13 DIAGNOSIS — I129 Hypertensive chronic kidney disease with stage 1 through stage 4 chronic kidney disease, or unspecified chronic kidney disease: Secondary | ICD-10-CM | POA: Diagnosis not present

## 2023-03-13 DIAGNOSIS — Z681 Body mass index (BMI) 19 or less, adult: Secondary | ICD-10-CM | POA: Diagnosis not present

## 2023-03-27 DIAGNOSIS — I10 Essential (primary) hypertension: Secondary | ICD-10-CM | POA: Diagnosis not present

## 2023-05-03 DIAGNOSIS — N390 Urinary tract infection, site not specified: Secondary | ICD-10-CM | POA: Diagnosis not present

## 2023-05-03 DIAGNOSIS — Z681 Body mass index (BMI) 19 or less, adult: Secondary | ICD-10-CM | POA: Diagnosis not present

## 2023-05-03 DIAGNOSIS — R030 Elevated blood-pressure reading, without diagnosis of hypertension: Secondary | ICD-10-CM | POA: Diagnosis not present

## 2023-11-13 DIAGNOSIS — M5430 Sciatica, unspecified side: Secondary | ICD-10-CM | POA: Diagnosis not present

## 2024-03-13 DIAGNOSIS — N390 Urinary tract infection, site not specified: Secondary | ICD-10-CM | POA: Diagnosis not present

## 2024-04-02 DIAGNOSIS — Z681 Body mass index (BMI) 19 or less, adult: Secondary | ICD-10-CM | POA: Diagnosis not present

## 2024-04-02 DIAGNOSIS — N1831 Chronic kidney disease, stage 3a: Secondary | ICD-10-CM | POA: Diagnosis not present

## 2024-04-02 DIAGNOSIS — E039 Hypothyroidism, unspecified: Secondary | ICD-10-CM | POA: Diagnosis not present

## 2024-04-02 DIAGNOSIS — Z96641 Presence of right artificial hip joint: Secondary | ICD-10-CM | POA: Diagnosis not present

## 2024-04-02 DIAGNOSIS — M1611 Unilateral primary osteoarthritis, right hip: Secondary | ICD-10-CM | POA: Diagnosis not present

## 2024-04-02 DIAGNOSIS — D500 Iron deficiency anemia secondary to blood loss (chronic): Secondary | ICD-10-CM | POA: Diagnosis not present

## 2024-04-02 DIAGNOSIS — I10 Essential (primary) hypertension: Secondary | ICD-10-CM | POA: Diagnosis not present

## 2024-04-02 DIAGNOSIS — R7309 Other abnormal glucose: Secondary | ICD-10-CM | POA: Diagnosis not present

## 2024-04-02 DIAGNOSIS — R1030 Lower abdominal pain, unspecified: Secondary | ICD-10-CM | POA: Diagnosis not present

## 2024-04-06 ENCOUNTER — Ambulatory Visit (HOSPITAL_COMMUNITY)
Admission: RE | Admit: 2024-04-06 | Discharge: 2024-04-06 | Disposition: A | Discharge status: Home / Self Care | Source: Ambulatory Visit | Attending: Family Medicine | Admitting: Family Medicine

## 2024-04-06 ENCOUNTER — Other Ambulatory Visit (HOSPITAL_COMMUNITY): Payer: Self-pay | Admitting: Family Medicine

## 2024-04-06 DIAGNOSIS — R1030 Lower abdominal pain, unspecified: Secondary | ICD-10-CM | POA: Insufficient documentation

## 2024-04-06 DIAGNOSIS — R1909 Other intra-abdominal and pelvic swelling, mass and lump: Secondary | ICD-10-CM | POA: Insufficient documentation

## 2024-04-06 DIAGNOSIS — R2241 Localized swelling, mass and lump, right lower limb: Secondary | ICD-10-CM | POA: Diagnosis not present

## 2024-04-13 ENCOUNTER — Encounter: Payer: Self-pay | Admitting: Oncology

## 2024-04-13 ENCOUNTER — Inpatient Hospital Stay: Attending: Oncology | Admitting: Oncology

## 2024-04-13 ENCOUNTER — Inpatient Hospital Stay

## 2024-04-13 ENCOUNTER — Other Ambulatory Visit: Payer: Self-pay | Admitting: *Deleted

## 2024-04-13 ENCOUNTER — Ambulatory Visit: Payer: Self-pay | Admitting: *Deleted

## 2024-04-13 VITALS — BP 165/55 | HR 111 | Temp 97.6°F | Resp 20 | Ht 65.5 in | Wt 121.5 lb

## 2024-04-13 DIAGNOSIS — R2241 Localized swelling, mass and lump, right lower limb: Secondary | ICD-10-CM | POA: Diagnosis not present

## 2024-04-13 DIAGNOSIS — D62 Acute posthemorrhagic anemia: Secondary | ICD-10-CM

## 2024-04-13 DIAGNOSIS — D649 Anemia, unspecified: Secondary | ICD-10-CM | POA: Insufficient documentation

## 2024-04-13 DIAGNOSIS — Z79899 Other long term (current) drug therapy: Secondary | ICD-10-CM | POA: Diagnosis not present

## 2024-04-13 DIAGNOSIS — Z801 Family history of malignant neoplasm of trachea, bronchus and lung: Secondary | ICD-10-CM | POA: Insufficient documentation

## 2024-04-13 DIAGNOSIS — E039 Hypothyroidism, unspecified: Secondary | ICD-10-CM | POA: Insufficient documentation

## 2024-04-13 LAB — CBC WITH DIFFERENTIAL/PLATELET
Abs Immature Granulocytes: 0.02 K/uL (ref 0.00–0.07)
Basophils Absolute: 0 K/uL (ref 0.0–0.1)
Basophils Relative: 0 %
Eosinophils Absolute: 0 K/uL (ref 0.0–0.5)
Eosinophils Relative: 0 %
HCT: 34.8 % — ABNORMAL LOW (ref 36.0–46.0)
Hemoglobin: 11.4 g/dL — ABNORMAL LOW (ref 12.0–15.0)
Immature Granulocytes: 0 %
Lymphocytes Relative: 16 %
Lymphs Abs: 1.6 K/uL (ref 0.7–4.0)
MCH: 29.4 pg (ref 26.0–34.0)
MCHC: 32.8 g/dL (ref 30.0–36.0)
MCV: 89.7 fL (ref 80.0–100.0)
Monocytes Absolute: 0.5 K/uL (ref 0.1–1.0)
Monocytes Relative: 6 %
Neutro Abs: 7.5 K/uL (ref 1.7–7.7)
Neutrophils Relative %: 78 %
Platelets: 303 K/uL (ref 150–400)
RBC: 3.88 MIL/uL (ref 3.87–5.11)
RDW: 13.2 % (ref 11.5–15.5)
WBC: 9.8 K/uL (ref 4.0–10.5)
nRBC: 0 % (ref 0.0–0.2)

## 2024-04-13 LAB — LACTATE DEHYDROGENASE: LDH: 125 U/L (ref 98–192)

## 2024-04-13 LAB — VITAMIN B12: Vitamin B-12: 195 pg/mL (ref 180–914)

## 2024-04-13 LAB — IRON AND TIBC
Iron: 83 ug/dL (ref 28–170)
Saturation Ratios: 26 % (ref 10.4–31.8)
TIBC: 317 ug/dL (ref 250–450)
UIBC: 234 ug/dL

## 2024-04-13 LAB — FOLATE: Folate: 10.6 ng/mL (ref >5.9)

## 2024-04-13 LAB — FERRITIN: Ferritin: 195 ng/mL (ref 11–307)

## 2024-04-13 MED ORDER — LEVOTHYROXINE SODIUM 75 MCG PO TABS: 75.0000 ug | ORAL_TABLET | Freq: Every day | ORAL | 0 refills | Status: AC

## 2024-04-13 NOTE — Progress Notes (Signed)
 Hematology-Oncology Clinic Note  Bertell Satterfield, MD   Reason for Referral: Soft tissue mass in the right medial thigh  Oncology History: I have reviewed her chart and materials related to her cancer extensively and collaborated history with the patient. Summary of oncologic history is as follows:  Oncology History  Mass of right thigh  04/06/2024 Imaging   Ultrasound of the right thigh:  IMPRESSION: Soft tissue mass- 9.0 x 9.1 x 925 cm heterogeneous solid mass with internal vascularity most consistent with a malignancy, possibly a sarcoma or Lymphoma in the medial right thigh    04/13/2024 Initial Diagnosis   Mass of right thigh       History of Presenting Illness: Olivia Ramsey 89 y.o. female is referred by Dr.Golding for right thigh mass.  She was accompanied by her daughter today.  Patient reports fatigue and palpitations, she has had the symptoms for the past few months.  She reported that she was told her that thyroid  level is abnormal but no adjustments to the medications were made. Patient stated that she first had right leg pain which she thought was sciatica in May, she then noticed the mass in her medial side of the thigh at the end of May and that it was probably by July.  She went to the primary care at this point and got ultrasound done which showed a concerning mass.  She reported that the mass is not painful, she is not losing weight, no loss of appetite.  Overall, she reports doing well.  She has no history of smoking, does not drink alcohol.  She has a family history of lung cancer in her father who was an avid tobacco chewer.  She lives alone and is fully functional.   Medical History: Past Medical History:  Diagnosis Date   GERD (gastroesophageal reflux disease)    Hypertension    Hypothyroidism    Thyroid  disease     Surgical history: Past Surgical History:  Procedure Laterality Date   BREAST SURGERY Right    cyst   CATARACT EXTRACTION W/PHACO Left  02/29/2020   Procedure: CATARACT EXTRACTION PHACO AND INTRAOCULAR LENS PLACEMENT (IOC);  Surgeon: Harrie Agent, MD;  Location: [facility];  Service: Ophthalmology;  Laterality: Left;  CDE: 10.16   CATARACT EXTRACTION W/PHACO Right 03/14/2020   Procedure: CATARACT EXTRACTION PHACO AND INTRAOCULAR LENS PLACEMENT RIGHT EYE;  Surgeon: Harrie Agent, MD;  Location: [facility];  Service: Ophthalmology;  Laterality: Right;  CDE: 8.59   TOTAL HIP ARTHROPLASTY Right 05/05/2022   Procedure: CEMENTED TOTAL HIP ARTHROPLASTY;  Surgeon: Edna Toribio LABOR, MD;  Location: [facility] OR;  Service: Orthopedics;  Laterality: Right;   Allergies:  is allergic to sulfa antibiotics.  Medications:  Current Outpatient Medications  Medication Sig Dispense Refill   acetaminophen  (TYLENOL ) 650 MG CR tablet Take 650 mg by mouth 3 (three) times daily. (Patient taking differently: Take 650 mg by mouth as needed.)     aspirin  EC 81 MG tablet Take 1 tablet (81 mg total) by mouth 2 (two) times daily. Swallow whole.     cholecalciferol  (VITAMIN D3) 25 MCG (1000 UNIT) tablet Take 1,000 Units by mouth daily.     Docusate Calcium (STOOL SOFTENER PO) Take by mouth.     ferrous sulfate  325 (65 FE) MG tablet Take 1 tablet (325 mg total) by mouth daily. 30 tablet 0   levothyroxine  (SYNTHROID ) 75 MCG tablet Take 1 tablet (75 mcg total) by mouth daily before breakfast. 30 tablet 0  losartan  (COZAAR ) 100 MG tablet Take 0.5 tablets (50 mg total) by mouth daily. 15 tablet 0   Multiple Vitamins-Minerals (WOMENS MULTIVITAMIN PO) Take by mouth.     senna (SENOKOT) 8.6 MG TABS tablet Take 1 tablet (8.6 mg total) by mouth 2 (two) times daily. 120 tablet 0   No current facility-administered medications for this visit.    Review of Systems: Constitutional: Denies fevers, chills or abnormal night sweats Eyes: Denies blurriness of vision, double vision or watery eyes Ears, nose, mouth, throat, and face: Denies mucositis or sore throat Respiratory: Denies  cough, dyspnea or wheezes Cardiovascular: Denies palpitation, chest discomfort or lower extremity swelling Gastrointestinal:  Denies nausea, heartburn or change in bowel habits Skin: Denies abnormal skin rashes Lymphatics: Denies new lymphadenopathy or easy bruising Neurological:Denies numbness, tingling or new weaknesses Behavioral/Psych: Mood is stable, no new changes  All other systems were reviewed with the patient and are negative.  Physical Examination: ECOG PERFORMANCE STATUS: 1 - Symptomatic but completely ambulatory  Vitals:   04/13/24 1010  BP: (!) 165/55  Pulse: (!) 111  Resp: 20  Temp: 97.6 F (36.4 C)  SpO2: 100%   Filed Weights   04/13/24 1010  Weight: 121 lb 8 oz (55.1 kg)    GENERAL:alert, no distress and comfortable SKIN: skin color, texture, turgor are normal, no rashes or significant lesions LYMPH:  no palpable lymphadenopathy in the cervical, axillary or inguinal LUNGS: clear to auscultation and percussion with normal breathing effort HEART: regular rate & rhythm and no murmurs and no lower extremity edema ABDOMEN:abdomen soft, non-tender and normal bowel sounds Musculoskeletal:15cm x 6cm mass in the medial side of the thigh, not adherent to the skin, mobile, nontender and no discharge.  No lymphadenopathy palpated. PSYCH: alert & oriented x 3 with fluent speech NEURO: no focal motor/sensory deficits   Laboratory Data: I have reviewed the data as listed and labs reviewed from primary care office drawn on 04/03/2024  CBC: WBC: 9, hemoglobin: 10.4, MCV: 87, hemoglobin: 13.8, platelets: 242 CMP: WNL with a creatinine of 1.05, GFR: 50 TSH: 0.415  Lab Results  Component Value Date   WBC 7.4 05/08/2022   HGB 8.9 (L) 05/17/2022   HCT 26.9 (L) 05/17/2022   MCV 87.7 05/08/2022   PLT 153 05/08/2022   Radiographic Studies: I have personally reviewed the radiological images as listed and agreed with the findings in the report.  US  RT LOWER EXTREM LTD SOFT  TISSUE NON VASCULAR CLINICAL DATA:  Right inguinal mass.  EXAM: ULTRASOUND right LOWER EXTREMITY LIMITED  TECHNIQUE: Ultrasound examination of the lower extremity soft tissues was performed in the area of clinical concern.  COMPARISON:  None Available.  FINDINGS: Targeted sonographic images of the medial right thigh was performed.  There is a 9.0 x 9.1 x 925 cm heterogeneous solid mass with internal vascularity most consistent with a malignancy, possibly a sarcoma or lymphoma. Other etiologies are not excluded. Further characterization with MRI without and with contrast or tissue sampling recommended.  No drainable fluid collection or abscess.  IMPRESSION: Soft tissue mass in the medial right thigh concerning for malignancy.  Electronically Signed   By: Vanetta Chou M.D.   On: 04/06/2024 14:24    ASSESSMENT & PLAN:  Patient is a 89 y.o. female presenting for mass of right thigh  Assessment & Plan Mass of right thigh Mass of right thigh likely consistent with sarcoma or lymphoma  - Will obtain a CT scan of the extremity to assess for the  extent of the disease - Will obtain labs today including CBC with differential, LDH and uric acid -Will refer for biopsy after the CT scan after assessing extent of the mass  Return to clinic 1 week after biopsy to discuss results and further management Normocytic anemia Patient's recent labs with primary care showed a hemoglobin of 10.4 and was normocytic Reported symptomatic improvement after started taking iron pills  - Continue to take oral iron every other day - Will obtain labs today including a repeat CBC and nutritional panel including ferritin level, iron panel, folate and vitamin B12.  Will replace as needed. Hypothyroidism (acquired) Patient's recent lab consistent with very low TSH.  - Recommended decreasing levothyroxine  to 75 mcg daily - Close follow-up with primary care as recommended    Orders Placed This  Encounter  Procedures   CT FEMUR RIGHT W CONTRAST    Standing Status:   Future    Expected Date:   04/14/2024    Expiration Date:   04/13/2025    If indicated for the ordered procedure, I authorize the administration of contrast media per Radiology protocol:   Yes    Does the patient have a contrast media/X-ray dye allergy?:   No    Preferred imaging location?:   [facility]    Release to patient:   Immediate [1]   Iron and TIBC ([facility] ONLY)    Standing Status:   Future    Number of Occurrences:   1    Expected Date:   04/13/2024    Expiration Date:   07/12/2024   Ferritin    Standing Status:   Future    Number of Occurrences:   1    Expected Date:   04/13/2024    Expiration Date:   07/12/2024   Vitamin B12    Standing Status:   Future    Number of Occurrences:   1    Expected Date:   04/13/2024    Expiration Date:   07/12/2024   Folate    Standing Status:   Future    Number of Occurrences:   1    Expected Date:   04/13/2024    Expiration Date:   07/12/2024   Lactate dehydrogenase    Standing Status:   Future    Number of Occurrences:   1    Expected Date:   04/13/2024    Expiration Date:   07/12/2024   CBC with Differential    Standing Status:   Future    Number of Occurrences:   1    Expected Date:   04/13/2024    Expiration Date:   04/13/2025    Release to patient:   Immediate [1]    Remote health to draw?:   No    The total time spent in the appointment was 60 minutes encounter with patients including review of chart and various tests results, discussions about plan of care and coordination of care plan   All questions were answered. The patient knows to call the clinic with any problems, questions or concerns. No barriers to learning was detected.  Mickiel Dry, MD 7/28/202511:08 AM

## 2024-04-13 NOTE — Assessment & Plan Note (Signed)
 Patient's recent labs with primary care showed a hemoglobin of 10.4 and was normocytic Reported symptomatic improvement after started taking iron pills  - Continue to take oral iron every other day - Will obtain labs today including a repeat CBC and nutritional panel including ferritin level, iron panel, folate and vitamin B12.  Will replace as needed.

## 2024-04-13 NOTE — Assessment & Plan Note (Addendum)
 Mass of right thigh likely consistent with sarcoma or lymphoma  - Will obtain a CT scan of the extremity to assess for the extent of the disease - Will obtain labs today including CBC with differential, LDH and uric acid -Will refer for biopsy after the CT scan after assessing extent of the mass  Return to clinic 1 week after biopsy to discuss results and further management

## 2024-04-13 NOTE — Assessment & Plan Note (Addendum)
 Patient's recent lab consistent with very low TSH.  - Recommended decreasing levothyroxine  to 75 mcg daily - Close follow-up with primary care as recommended

## 2024-04-13 NOTE — Progress Notes (Signed)
 Patient made aware and verbalized understanding.

## 2024-04-13 NOTE — Patient Instructions (Addendum)
 [facility]  Discharge Instructions  You were seen and examined today by Dr. Davonna. Dr. Davonna is a medical oncologist, meaning that he specializes in the treatment of cancer diagnoses. Dr. Davonna discussed your past medical history, family history of cancers, and the events that led to you being here today.  You were referred to Dr. Davonna due to an abnormal ultrasound that revealed a concerning soft tissue mass on your thigh. It is possible that it is a cancerous mass.  Dr. Davonna has recommended a CT scan to see if there is any spread of the cancer. Dr. Davonna has also recommended a biopsy.  Follow-up as scheduled.  Thank you for choosing [facility] to provide your oncology and hematology care.   To afford each patient quality time with our provider, please arrive at least 15 minutes before your scheduled appointment time. You may need to reschedule your appointment if you arrive late (10 or more minutes). Arriving late affects you and other patients whose appointments are after yours.  Also, if you miss three or more appointments without notifying the office, you may be dismissed from the clinic at the provider's discretion.    Again, thank you for choosing [facility].  Our hope is that these requests will decrease the amount of time that you wait before being seen by our physicians.   If you have a lab appointment with the Cancer Center - please note that after April 8th, all labs will be drawn in the cancer center.  You do not have to check in or register with the main entrance as you have in the past but will complete your check-in at the cancer center.            _____________________________________________________________  Should you have questions after your visit to [facility], please contact our office at [phone] and follow the prompts.  Our office hours are 8:00 a.m. to 4:30 p.m.  Monday - Thursday and 8:00 a.m. to 2:30 p.m. Friday.  Please note that voicemails left after 4:00 p.m. may not be returned until the following business day.  We are closed weekends and all major holidays.  You do have access to a nurse 24-7, just call the main number to the clinic [phone] and do not press any options, hold on the line and a nurse will answer the phone.    For prescription refill requests, have your pharmacy contact our office and allow 72 hours.    Masks are no longer required in the cancer centers. If you would like for your care team to wear a mask while they are taking care of you, please let them know. You may have one support person who is at least 88 years old accompany you for your appointments.

## 2024-04-20 ENCOUNTER — Ambulatory Visit (HOSPITAL_COMMUNITY)
Admission: RE | Admit: 2024-04-20 | Discharge: 2024-04-20 | Disposition: A | Discharge status: Home / Self Care | Source: Ambulatory Visit | Attending: Oncology | Admitting: Oncology

## 2024-04-20 DIAGNOSIS — D62 Acute posthemorrhagic anemia: Secondary | ICD-10-CM | POA: Insufficient documentation

## 2024-04-20 DIAGNOSIS — R2241 Localized swelling, mass and lump, right lower limb: Secondary | ICD-10-CM | POA: Insufficient documentation

## 2024-04-20 DIAGNOSIS — Z471 Aftercare following joint replacement surgery: Secondary | ICD-10-CM | POA: Diagnosis not present

## 2024-04-20 MED ORDER — IOHEXOL 300 MG/ML  SOLN
75.0000 mL | Freq: Once | INTRAMUSCULAR | Status: AC | PRN
  Administered 2024-04-20: 75 mL via INTRAVENOUS

## 2024-04-21 NOTE — Progress Notes (Signed)
 Patient's daughter, Amy, called back this morning requesting that referral be sent to the [facility] at [facility]. Per Dr. Davonna, she will follow-up with patient after tissue diagnosis. Follow-up appointment on 8/8 has been cancelled, patient's daughter verbalized understanding. I have asked the daughter to call back if she has not received a call with a new patient appointment within a week. All questions addressed and answered to family's satisfaction.

## 2024-04-21 NOTE — Progress Notes (Signed)
 Called patient regarding results from CT scan and recommendation for referral to a orthopedic oncologist for biopsy versus surgical evaluation at [facility] or [facility] [employer]. Patient verbalized understanding and wishes to discuss with daughter, Amy. Patient states that either her or Amy will call back with their decision.

## 2024-04-24 ENCOUNTER — Inpatient Hospital Stay: Admitting: Oncology

## 2024-04-27 DIAGNOSIS — E039 Hypothyroidism, unspecified: Secondary | ICD-10-CM | POA: Diagnosis not present

## 2024-04-27 DIAGNOSIS — C499 Malignant neoplasm of connective and soft tissue, unspecified: Secondary | ICD-10-CM | POA: Diagnosis not present

## 2024-04-27 DIAGNOSIS — I10 Essential (primary) hypertension: Secondary | ICD-10-CM | POA: Diagnosis not present

## 2024-04-27 DIAGNOSIS — Z7689 Persons encountering health services in other specified circumstances: Secondary | ICD-10-CM | POA: Diagnosis not present

## 2024-04-27 DIAGNOSIS — D649 Anemia, unspecified: Secondary | ICD-10-CM | POA: Diagnosis not present

## 2024-04-27 DIAGNOSIS — Z6820 Body mass index (BMI) 20.0-20.9, adult: Secondary | ICD-10-CM | POA: Diagnosis not present

## 2024-05-04 DIAGNOSIS — R300 Dysuria: Secondary | ICD-10-CM | POA: Diagnosis not present

## 2024-05-04 DIAGNOSIS — E039 Hypothyroidism, unspecified: Secondary | ICD-10-CM | POA: Diagnosis not present

## 2024-05-04 DIAGNOSIS — Z79890 Hormone replacement therapy (postmenopausal): Secondary | ICD-10-CM | POA: Diagnosis not present

## 2024-05-06 DIAGNOSIS — S71111A Laceration without foreign body, right thigh, initial encounter: Secondary | ICD-10-CM | POA: Diagnosis not present

## 2024-05-06 DIAGNOSIS — R2241 Localized swelling, mass and lump, right lower limb: Secondary | ICD-10-CM | POA: Diagnosis not present

## 2024-05-06 DIAGNOSIS — Z96641 Presence of right artificial hip joint: Secondary | ICD-10-CM | POA: Diagnosis not present

## 2024-05-06 DIAGNOSIS — X58XXXA Exposure to other specified factors, initial encounter: Secondary | ICD-10-CM | POA: Diagnosis not present

## 2024-05-06 DIAGNOSIS — S76311A Strain of muscle, fascia and tendon of the posterior muscle group at thigh level, right thigh, initial encounter: Secondary | ICD-10-CM | POA: Diagnosis not present

## 2024-05-07 DIAGNOSIS — M7989 Other specified soft tissue disorders: Secondary | ICD-10-CM | POA: Diagnosis not present

## 2024-05-07 DIAGNOSIS — C4921 Malignant neoplasm of connective and soft tissue of right lower limb, including hip: Secondary | ICD-10-CM | POA: Diagnosis not present

## 2024-05-25 DIAGNOSIS — I129 Hypertensive chronic kidney disease with stage 1 through stage 4 chronic kidney disease, or unspecified chronic kidney disease: Secondary | ICD-10-CM | POA: Diagnosis not present

## 2024-05-25 DIAGNOSIS — E1100 Type 2 diabetes mellitus with hyperosmolarity without nonketotic hyperglycemic-hyperosmolar coma (NKHHC): Secondary | ICD-10-CM | POA: Diagnosis not present

## 2024-05-25 DIAGNOSIS — M7989 Other specified soft tissue disorders: Secondary | ICD-10-CM | POA: Diagnosis not present

## 2024-05-25 DIAGNOSIS — E7800 Pure hypercholesterolemia, unspecified: Secondary | ICD-10-CM | POA: Diagnosis not present

## 2024-05-25 DIAGNOSIS — D631 Anemia in chronic kidney disease: Secondary | ICD-10-CM | POA: Diagnosis not present

## 2024-05-25 DIAGNOSIS — E1122 Type 2 diabetes mellitus with diabetic chronic kidney disease: Secondary | ICD-10-CM | POA: Diagnosis not present

## 2024-05-25 DIAGNOSIS — C499 Malignant neoplasm of connective and soft tissue, unspecified: Secondary | ICD-10-CM | POA: Diagnosis not present

## 2024-05-25 DIAGNOSIS — R2241 Localized swelling, mass and lump, right lower limb: Secondary | ICD-10-CM | POA: Diagnosis not present

## 2024-05-25 DIAGNOSIS — R29898 Other symptoms and signs involving the musculoskeletal system: Secondary | ICD-10-CM | POA: Diagnosis not present

## 2024-05-25 DIAGNOSIS — Z01818 Encounter for other preprocedural examination: Secondary | ICD-10-CM | POA: Diagnosis not present

## 2024-05-25 DIAGNOSIS — N183 Chronic kidney disease, stage 3 unspecified: Secondary | ICD-10-CM | POA: Diagnosis not present

## 2024-05-25 DIAGNOSIS — K5909 Other constipation: Secondary | ICD-10-CM | POA: Diagnosis not present

## 2024-05-25 DIAGNOSIS — D649 Anemia, unspecified: Secondary | ICD-10-CM | POA: Diagnosis not present

## 2024-05-25 DIAGNOSIS — R002 Palpitations: Secondary | ICD-10-CM | POA: Diagnosis not present

## 2024-05-25 DIAGNOSIS — N1831 Chronic kidney disease, stage 3a: Secondary | ICD-10-CM | POA: Diagnosis not present

## 2024-05-25 DIAGNOSIS — E039 Hypothyroidism, unspecified: Secondary | ICD-10-CM | POA: Diagnosis not present

## 2024-05-26 DIAGNOSIS — H524 Presbyopia: Secondary | ICD-10-CM | POA: Diagnosis not present

## 2024-05-28 DIAGNOSIS — C4921 Malignant neoplasm of connective and soft tissue of right lower limb, including hip: Secondary | ICD-10-CM | POA: Diagnosis not present

## 2024-05-28 DIAGNOSIS — R918 Other nonspecific abnormal finding of lung field: Secondary | ICD-10-CM | POA: Diagnosis not present

## 2024-06-03 DIAGNOSIS — R300 Dysuria: Secondary | ICD-10-CM | POA: Diagnosis not present

## 2024-06-04 DIAGNOSIS — Z79899 Other long term (current) drug therapy: Secondary | ICD-10-CM | POA: Diagnosis not present

## 2024-06-04 DIAGNOSIS — I480 Paroxysmal atrial fibrillation: Secondary | ICD-10-CM | POA: Diagnosis not present

## 2024-06-04 DIAGNOSIS — I10 Essential (primary) hypertension: Secondary | ICD-10-CM | POA: Diagnosis not present

## 2024-06-04 DIAGNOSIS — N1831 Chronic kidney disease, stage 3a: Secondary | ICD-10-CM | POA: Diagnosis not present

## 2024-06-04 DIAGNOSIS — E1122 Type 2 diabetes mellitus with diabetic chronic kidney disease: Secondary | ICD-10-CM | POA: Diagnosis not present

## 2024-06-04 DIAGNOSIS — I129 Hypertensive chronic kidney disease with stage 1 through stage 4 chronic kidney disease, or unspecified chronic kidney disease: Secondary | ICD-10-CM | POA: Diagnosis not present

## 2024-06-04 DIAGNOSIS — R0609 Other forms of dyspnea: Secondary | ICD-10-CM | POA: Diagnosis not present

## 2024-06-04 DIAGNOSIS — Z7982 Long term (current) use of aspirin: Secondary | ICD-10-CM | POA: Diagnosis not present

## 2024-06-04 DIAGNOSIS — C4921 Malignant neoplasm of connective and soft tissue of right lower limb, including hip: Secondary | ICD-10-CM | POA: Diagnosis not present

## 2024-06-05 DIAGNOSIS — H52222 Regular astigmatism, left eye: Secondary | ICD-10-CM | POA: Diagnosis not present

## 2024-06-05 DIAGNOSIS — H524 Presbyopia: Secondary | ICD-10-CM | POA: Diagnosis not present

## 2024-06-11 DIAGNOSIS — R0609 Other forms of dyspnea: Secondary | ICD-10-CM | POA: Diagnosis not present

## 2024-06-11 DIAGNOSIS — C499 Malignant neoplasm of connective and soft tissue, unspecified: Secondary | ICD-10-CM | POA: Diagnosis not present

## 2024-06-11 DIAGNOSIS — R42 Dizziness and giddiness: Secondary | ICD-10-CM | POA: Diagnosis not present

## 2024-06-11 DIAGNOSIS — R0789 Other chest pain: Secondary | ICD-10-CM | POA: Diagnosis not present

## 2024-06-17 DIAGNOSIS — Z9189 Other specified personal risk factors, not elsewhere classified: Secondary | ICD-10-CM | POA: Diagnosis not present

## 2024-06-17 DIAGNOSIS — E538 Deficiency of other specified B group vitamins: Secondary | ICD-10-CM | POA: Diagnosis not present

## 2024-06-17 DIAGNOSIS — Z1331 Encounter for screening for depression: Secondary | ICD-10-CM | POA: Diagnosis not present

## 2024-06-17 DIAGNOSIS — M7989 Other specified soft tissue disorders: Secondary | ICD-10-CM | POA: Diagnosis not present

## 2024-06-17 DIAGNOSIS — I129 Hypertensive chronic kidney disease with stage 1 through stage 4 chronic kidney disease, or unspecified chronic kidney disease: Secondary | ICD-10-CM | POA: Diagnosis not present

## 2024-06-17 DIAGNOSIS — Z01818 Encounter for other preprocedural examination: Secondary | ICD-10-CM | POA: Diagnosis not present

## 2024-06-17 DIAGNOSIS — E039 Hypothyroidism, unspecified: Secondary | ICD-10-CM | POA: Diagnosis not present

## 2024-06-17 DIAGNOSIS — E1122 Type 2 diabetes mellitus with diabetic chronic kidney disease: Secondary | ICD-10-CM | POA: Diagnosis not present

## 2024-06-17 DIAGNOSIS — K5909 Other constipation: Secondary | ICD-10-CM | POA: Diagnosis not present

## 2024-06-17 DIAGNOSIS — Z713 Dietary counseling and surveillance: Secondary | ICD-10-CM | POA: Diagnosis not present

## 2024-06-17 DIAGNOSIS — N1831 Chronic kidney disease, stage 3a: Secondary | ICD-10-CM | POA: Diagnosis not present

## 2024-06-17 DIAGNOSIS — C4921 Malignant neoplasm of connective and soft tissue of right lower limb, including hip: Secondary | ICD-10-CM | POA: Diagnosis not present

## 2024-06-17 DIAGNOSIS — D649 Anemia, unspecified: Secondary | ICD-10-CM | POA: Diagnosis not present

## 2024-06-18 DIAGNOSIS — C4921 Malignant neoplasm of connective and soft tissue of right lower limb, including hip: Secondary | ICD-10-CM | POA: Diagnosis not present

## 2024-06-18 DIAGNOSIS — Z510 Encounter for antineoplastic radiation therapy: Secondary | ICD-10-CM | POA: Diagnosis not present

## 2024-06-19 DIAGNOSIS — R29898 Other symptoms and signs involving the musculoskeletal system: Secondary | ICD-10-CM | POA: Diagnosis not present

## 2024-06-19 DIAGNOSIS — C4921 Malignant neoplasm of connective and soft tissue of right lower limb, including hip: Secondary | ICD-10-CM | POA: Diagnosis not present

## 2024-06-19 DIAGNOSIS — C499 Malignant neoplasm of connective and soft tissue, unspecified: Secondary | ICD-10-CM | POA: Diagnosis not present

## 2024-06-19 DIAGNOSIS — R2241 Localized swelling, mass and lump, right lower limb: Secondary | ICD-10-CM | POA: Diagnosis not present

## 2024-06-19 DIAGNOSIS — Z510 Encounter for antineoplastic radiation therapy: Secondary | ICD-10-CM | POA: Diagnosis not present

## 2024-06-22 DIAGNOSIS — Z510 Encounter for antineoplastic radiation therapy: Secondary | ICD-10-CM | POA: Diagnosis not present

## 2024-06-22 DIAGNOSIS — C4921 Malignant neoplasm of connective and soft tissue of right lower limb, including hip: Secondary | ICD-10-CM | POA: Diagnosis not present

## 2024-06-23 DIAGNOSIS — C4921 Malignant neoplasm of connective and soft tissue of right lower limb, including hip: Secondary | ICD-10-CM | POA: Diagnosis not present

## 2024-06-23 DIAGNOSIS — Z510 Encounter for antineoplastic radiation therapy: Secondary | ICD-10-CM | POA: Diagnosis not present

## 2024-06-24 DIAGNOSIS — Z510 Encounter for antineoplastic radiation therapy: Secondary | ICD-10-CM | POA: Diagnosis not present

## 2024-06-24 DIAGNOSIS — C4921 Malignant neoplasm of connective and soft tissue of right lower limb, including hip: Secondary | ICD-10-CM | POA: Diagnosis not present

## 2024-06-25 DIAGNOSIS — C4921 Malignant neoplasm of connective and soft tissue of right lower limb, including hip: Secondary | ICD-10-CM | POA: Diagnosis not present

## 2024-06-25 DIAGNOSIS — Z510 Encounter for antineoplastic radiation therapy: Secondary | ICD-10-CM | POA: Diagnosis not present

## 2024-06-26 DIAGNOSIS — Z510 Encounter for antineoplastic radiation therapy: Secondary | ICD-10-CM | POA: Diagnosis not present

## 2024-06-26 DIAGNOSIS — C4921 Malignant neoplasm of connective and soft tissue of right lower limb, including hip: Secondary | ICD-10-CM | POA: Diagnosis not present

## 2024-06-29 DIAGNOSIS — Z510 Encounter for antineoplastic radiation therapy: Secondary | ICD-10-CM | POA: Diagnosis not present

## 2024-06-29 DIAGNOSIS — C4921 Malignant neoplasm of connective and soft tissue of right lower limb, including hip: Secondary | ICD-10-CM | POA: Diagnosis not present

## 2024-06-30 DIAGNOSIS — Z510 Encounter for antineoplastic radiation therapy: Secondary | ICD-10-CM | POA: Diagnosis not present

## 2024-06-30 DIAGNOSIS — C4921 Malignant neoplasm of connective and soft tissue of right lower limb, including hip: Secondary | ICD-10-CM | POA: Diagnosis not present

## 2024-07-01 DIAGNOSIS — Z510 Encounter for antineoplastic radiation therapy: Secondary | ICD-10-CM | POA: Diagnosis not present

## 2024-07-01 DIAGNOSIS — C4921 Malignant neoplasm of connective and soft tissue of right lower limb, including hip: Secondary | ICD-10-CM | POA: Diagnosis not present

## 2024-07-02 DIAGNOSIS — C4921 Malignant neoplasm of connective and soft tissue of right lower limb, including hip: Secondary | ICD-10-CM | POA: Diagnosis not present

## 2024-07-02 DIAGNOSIS — Z510 Encounter for antineoplastic radiation therapy: Secondary | ICD-10-CM | POA: Diagnosis not present

## 2024-07-03 DIAGNOSIS — R29898 Other symptoms and signs involving the musculoskeletal system: Secondary | ICD-10-CM | POA: Diagnosis not present

## 2024-07-03 DIAGNOSIS — Z510 Encounter for antineoplastic radiation therapy: Secondary | ICD-10-CM | POA: Diagnosis not present

## 2024-07-03 DIAGNOSIS — C499 Malignant neoplasm of connective and soft tissue, unspecified: Secondary | ICD-10-CM | POA: Diagnosis not present

## 2024-07-03 DIAGNOSIS — C4921 Malignant neoplasm of connective and soft tissue of right lower limb, including hip: Secondary | ICD-10-CM | POA: Diagnosis not present

## 2024-07-03 DIAGNOSIS — R2241 Localized swelling, mass and lump, right lower limb: Secondary | ICD-10-CM | POA: Diagnosis not present

## 2024-07-06 DIAGNOSIS — Z510 Encounter for antineoplastic radiation therapy: Secondary | ICD-10-CM | POA: Diagnosis not present

## 2024-07-06 DIAGNOSIS — C4921 Malignant neoplasm of connective and soft tissue of right lower limb, including hip: Secondary | ICD-10-CM | POA: Diagnosis not present

## 2024-07-07 DIAGNOSIS — C4921 Malignant neoplasm of connective and soft tissue of right lower limb, including hip: Secondary | ICD-10-CM | POA: Diagnosis not present

## 2024-07-07 DIAGNOSIS — Z510 Encounter for antineoplastic radiation therapy: Secondary | ICD-10-CM | POA: Diagnosis not present

## 2024-07-08 DIAGNOSIS — C4921 Malignant neoplasm of connective and soft tissue of right lower limb, including hip: Secondary | ICD-10-CM | POA: Diagnosis not present

## 2024-07-08 DIAGNOSIS — Z510 Encounter for antineoplastic radiation therapy: Secondary | ICD-10-CM | POA: Diagnosis not present

## 2024-07-08 DIAGNOSIS — C7651 Malignant neoplasm of right lower limb: Secondary | ICD-10-CM | POA: Diagnosis not present

## 2024-07-09 DIAGNOSIS — C4921 Malignant neoplasm of connective and soft tissue of right lower limb, including hip: Secondary | ICD-10-CM | POA: Diagnosis not present

## 2024-07-09 DIAGNOSIS — Z510 Encounter for antineoplastic radiation therapy: Secondary | ICD-10-CM | POA: Diagnosis not present

## 2024-07-10 DIAGNOSIS — C4921 Malignant neoplasm of connective and soft tissue of right lower limb, including hip: Secondary | ICD-10-CM | POA: Diagnosis not present

## 2024-07-10 DIAGNOSIS — Z510 Encounter for antineoplastic radiation therapy: Secondary | ICD-10-CM | POA: Diagnosis not present

## 2024-07-13 DIAGNOSIS — Z510 Encounter for antineoplastic radiation therapy: Secondary | ICD-10-CM | POA: Diagnosis not present

## 2024-07-13 DIAGNOSIS — C4921 Malignant neoplasm of connective and soft tissue of right lower limb, including hip: Secondary | ICD-10-CM | POA: Diagnosis not present

## 2024-07-14 DIAGNOSIS — Z510 Encounter for antineoplastic radiation therapy: Secondary | ICD-10-CM | POA: Diagnosis not present

## 2024-07-14 DIAGNOSIS — C4921 Malignant neoplasm of connective and soft tissue of right lower limb, including hip: Secondary | ICD-10-CM | POA: Diagnosis not present

## 2024-07-15 DIAGNOSIS — C4921 Malignant neoplasm of connective and soft tissue of right lower limb, including hip: Secondary | ICD-10-CM | POA: Diagnosis not present

## 2024-07-15 DIAGNOSIS — Z510 Encounter for antineoplastic radiation therapy: Secondary | ICD-10-CM | POA: Diagnosis not present

## 2024-07-16 DIAGNOSIS — Z510 Encounter for antineoplastic radiation therapy: Secondary | ICD-10-CM | POA: Diagnosis not present

## 2024-07-16 DIAGNOSIS — C4921 Malignant neoplasm of connective and soft tissue of right lower limb, including hip: Secondary | ICD-10-CM | POA: Diagnosis not present

## 2024-07-17 DIAGNOSIS — C4921 Malignant neoplasm of connective and soft tissue of right lower limb, including hip: Secondary | ICD-10-CM | POA: Diagnosis not present

## 2024-07-17 DIAGNOSIS — Z510 Encounter for antineoplastic radiation therapy: Secondary | ICD-10-CM | POA: Diagnosis not present

## 2024-07-20 DIAGNOSIS — C4921 Malignant neoplasm of connective and soft tissue of right lower limb, including hip: Secondary | ICD-10-CM | POA: Diagnosis not present

## 2024-07-20 DIAGNOSIS — Z510 Encounter for antineoplastic radiation therapy: Secondary | ICD-10-CM | POA: Diagnosis not present

## 2024-07-21 DIAGNOSIS — Z510 Encounter for antineoplastic radiation therapy: Secondary | ICD-10-CM | POA: Diagnosis not present

## 2024-07-21 DIAGNOSIS — C4921 Malignant neoplasm of connective and soft tissue of right lower limb, including hip: Secondary | ICD-10-CM | POA: Diagnosis not present

## 2024-08-06 DIAGNOSIS — R600 Localized edema: Secondary | ICD-10-CM | POA: Diagnosis not present

## 2024-08-06 DIAGNOSIS — C4921 Malignant neoplasm of connective and soft tissue of right lower limb, including hip: Secondary | ICD-10-CM | POA: Diagnosis not present

## 2024-08-26 DIAGNOSIS — C499 Malignant neoplasm of connective and soft tissue, unspecified: Secondary | ICD-10-CM | POA: Diagnosis not present

## 2024-08-26 DIAGNOSIS — Z01818 Encounter for other preprocedural examination: Secondary | ICD-10-CM | POA: Diagnosis not present

## 2024-08-26 DIAGNOSIS — I10 Essential (primary) hypertension: Secondary | ICD-10-CM | POA: Diagnosis not present

## 2024-08-26 DIAGNOSIS — N1831 Chronic kidney disease, stage 3a: Secondary | ICD-10-CM | POA: Diagnosis not present

## 2024-08-26 DIAGNOSIS — E039 Hypothyroidism, unspecified: Secondary | ICD-10-CM | POA: Diagnosis not present

## 2024-08-26 DIAGNOSIS — C4921 Malignant neoplasm of connective and soft tissue of right lower limb, including hip: Secondary | ICD-10-CM | POA: Diagnosis not present

## 2024-09-07 DIAGNOSIS — C4921 Malignant neoplasm of connective and soft tissue of right lower limb, including hip: Secondary | ICD-10-CM | POA: Diagnosis not present
